# Patient Record
Sex: Female | Born: 1955
Health system: Southern US, Community
[De-identification: ages and names within clinical notes are randomized; demographics above are authoritative.]

## PROBLEM LIST (undated history)

## (undated) DIAGNOSIS — D219 Benign neoplasm of connective and other soft tissue, unspecified: Secondary | ICD-10-CM

## (undated) DIAGNOSIS — T884XXA Failed or difficult intubation, initial encounter: Secondary | ICD-10-CM

## (undated) DIAGNOSIS — B029 Zoster without complications: Secondary | ICD-10-CM

## (undated) DIAGNOSIS — K589 Irritable bowel syndrome without diarrhea: Secondary | ICD-10-CM

## (undated) DIAGNOSIS — E785 Hyperlipidemia, unspecified: Secondary | ICD-10-CM

## (undated) DIAGNOSIS — R87619 Unspecified abnormal cytological findings in specimens from cervix uteri: Secondary | ICD-10-CM

## (undated) DIAGNOSIS — I10 Essential (primary) hypertension: Secondary | ICD-10-CM

## (undated) DIAGNOSIS — C801 Malignant (primary) neoplasm, unspecified: Secondary | ICD-10-CM

## (undated) HISTORY — DX: Hyperlipidemia, unspecified: E78.5

## (undated) HISTORY — DX: Malignant (primary) neoplasm, unspecified: C80.1

## (undated) HISTORY — DX: Irritable bowel syndrome, unspecified: K58.9

## (undated) HISTORY — PX: TUBAL LIGATION: SHX77

## (undated) HISTORY — DX: Benign neoplasm of connective and other soft tissue, unspecified: D21.9

## (undated) HISTORY — DX: Unspecified abnormal cytological findings in specimens from cervix uteri: R87.619

## (undated) HISTORY — PX: CERVICAL BIOPSY  W/ LOOP ELECTRODE EXCISION: SUR135

## (undated) HISTORY — DX: Essential (primary) hypertension: I10

## (undated) HISTORY — DX: Zoster without complications: B02.9

## (undated) HISTORY — PX: TONSILLECTOMY: SUR1361

## (undated) HISTORY — PX: COLPOSCOPY: SHX161

---

## 1998-02-07 ENCOUNTER — Other Ambulatory Visit: Admission: RE | Admit: 1998-02-07 | Discharge: 1998-02-07 | Payer: Self-pay | Admitting: *Deleted

## 1998-06-17 ENCOUNTER — Ambulatory Visit (HOSPITAL_COMMUNITY): Admission: RE | Admit: 1998-06-17 | Discharge: 1998-06-17 | Payer: Self-pay | Admitting: Gastroenterology

## 1998-08-23 ENCOUNTER — Other Ambulatory Visit: Admission: RE | Admit: 1998-08-23 | Discharge: 1998-08-23 | Payer: Self-pay | Admitting: *Deleted

## 1999-05-01 ENCOUNTER — Other Ambulatory Visit: Admission: RE | Admit: 1999-05-01 | Discharge: 1999-05-01 | Payer: Self-pay | Admitting: *Deleted

## 1999-11-03 ENCOUNTER — Encounter: Payer: Self-pay | Admitting: Emergency Medicine

## 1999-11-03 ENCOUNTER — Encounter: Admission: RE | Admit: 1999-11-03 | Discharge: 1999-11-03 | Payer: Self-pay | Admitting: Emergency Medicine

## 2000-02-24 ENCOUNTER — Other Ambulatory Visit: Admission: RE | Admit: 2000-02-24 | Discharge: 2000-02-24 | Payer: Self-pay | Admitting: *Deleted

## 2000-08-26 ENCOUNTER — Other Ambulatory Visit: Admission: RE | Admit: 2000-08-26 | Discharge: 2000-08-26 | Payer: Self-pay | Admitting: *Deleted

## 2000-11-03 ENCOUNTER — Encounter: Admission: RE | Admit: 2000-11-03 | Discharge: 2000-11-03 | Payer: Self-pay | Admitting: *Deleted

## 2000-11-09 ENCOUNTER — Encounter: Payer: Self-pay | Admitting: Emergency Medicine

## 2000-11-09 ENCOUNTER — Encounter: Admission: RE | Admit: 2000-11-09 | Discharge: 2000-11-09 | Payer: Self-pay | Admitting: Emergency Medicine

## 2001-03-30 ENCOUNTER — Other Ambulatory Visit: Admission: RE | Admit: 2001-03-30 | Discharge: 2001-03-30 | Payer: Self-pay | Admitting: *Deleted

## 2001-10-12 ENCOUNTER — Other Ambulatory Visit: Admission: RE | Admit: 2001-10-12 | Discharge: 2001-10-12 | Payer: Self-pay | Admitting: Obstetrics and Gynecology

## 2001-12-01 ENCOUNTER — Encounter: Payer: Self-pay | Admitting: Emergency Medicine

## 2001-12-01 ENCOUNTER — Encounter: Admission: RE | Admit: 2001-12-01 | Discharge: 2001-12-01 | Payer: Self-pay | Admitting: Emergency Medicine

## 2002-01-11 ENCOUNTER — Other Ambulatory Visit: Admission: RE | Admit: 2002-01-11 | Discharge: 2002-01-11 | Payer: Self-pay | Admitting: Obstetrics and Gynecology

## 2002-03-31 ENCOUNTER — Other Ambulatory Visit: Admission: RE | Admit: 2002-03-31 | Discharge: 2002-03-31 | Payer: Self-pay | Admitting: Obstetrics and Gynecology

## 2002-04-13 HISTORY — PX: CARPAL TUNNEL RELEASE: SHX101

## 2002-10-10 ENCOUNTER — Other Ambulatory Visit: Admission: RE | Admit: 2002-10-10 | Discharge: 2002-10-10 | Payer: Self-pay | Admitting: Obstetrics and Gynecology

## 2002-12-05 ENCOUNTER — Encounter: Admission: RE | Admit: 2002-12-05 | Discharge: 2002-12-05 | Payer: Self-pay | Admitting: Emergency Medicine

## 2002-12-05 ENCOUNTER — Encounter: Payer: Self-pay | Admitting: Emergency Medicine

## 2003-05-29 ENCOUNTER — Other Ambulatory Visit: Admission: RE | Admit: 2003-05-29 | Discharge: 2003-05-29 | Payer: Self-pay | Admitting: Obstetrics and Gynecology

## 2003-10-24 ENCOUNTER — Other Ambulatory Visit: Admission: RE | Admit: 2003-10-24 | Discharge: 2003-10-24 | Payer: Self-pay | Admitting: Obstetrics and Gynecology

## 2003-12-06 ENCOUNTER — Encounter: Admission: RE | Admit: 2003-12-06 | Discharge: 2003-12-06 | Payer: Self-pay | Admitting: Emergency Medicine

## 2004-02-20 ENCOUNTER — Other Ambulatory Visit: Admission: RE | Admit: 2004-02-20 | Discharge: 2004-02-20 | Payer: Self-pay | Admitting: Obstetrics and Gynecology

## 2004-06-06 ENCOUNTER — Other Ambulatory Visit: Admission: RE | Admit: 2004-06-06 | Discharge: 2004-06-06 | Payer: Self-pay | Admitting: Obstetrics and Gynecology

## 2004-12-10 ENCOUNTER — Encounter: Admission: RE | Admit: 2004-12-10 | Discharge: 2004-12-10 | Payer: Self-pay | Admitting: Emergency Medicine

## 2004-12-24 ENCOUNTER — Other Ambulatory Visit: Admission: RE | Admit: 2004-12-24 | Discharge: 2004-12-24 | Payer: Self-pay | Admitting: Obstetrics and Gynecology

## 2005-06-29 ENCOUNTER — Other Ambulatory Visit: Admission: RE | Admit: 2005-06-29 | Discharge: 2005-06-29 | Payer: Self-pay | Admitting: Obstetrics and Gynecology

## 2005-12-03 ENCOUNTER — Ambulatory Visit (HOSPITAL_COMMUNITY): Admission: RE | Admit: 2005-12-03 | Discharge: 2005-12-03 | Payer: Self-pay | Admitting: Emergency Medicine

## 2005-12-16 ENCOUNTER — Encounter: Admission: RE | Admit: 2005-12-16 | Discharge: 2005-12-16 | Payer: Self-pay | Admitting: Obstetrics and Gynecology

## 2006-06-30 ENCOUNTER — Other Ambulatory Visit: Admission: RE | Admit: 2006-06-30 | Discharge: 2006-06-30 | Payer: Self-pay | Admitting: Obstetrics and Gynecology

## 2006-12-21 ENCOUNTER — Encounter: Admission: RE | Admit: 2006-12-21 | Discharge: 2006-12-21 | Payer: Self-pay | Admitting: Obstetrics and Gynecology

## 2007-07-06 ENCOUNTER — Other Ambulatory Visit: Admission: RE | Admit: 2007-07-06 | Discharge: 2007-07-06 | Payer: Self-pay | Admitting: Obstetrics and Gynecology

## 2007-12-22 ENCOUNTER — Encounter: Admission: RE | Admit: 2007-12-22 | Discharge: 2007-12-22 | Payer: Self-pay | Admitting: Obstetrics and Gynecology

## 2008-07-11 ENCOUNTER — Other Ambulatory Visit: Admission: RE | Admit: 2008-07-11 | Discharge: 2008-07-11 | Payer: Self-pay | Admitting: Obstetrics and Gynecology

## 2009-01-01 ENCOUNTER — Encounter: Admission: RE | Admit: 2009-01-01 | Discharge: 2009-01-01 | Payer: Self-pay

## 2010-01-02 ENCOUNTER — Encounter: Admission: RE | Admit: 2010-01-02 | Discharge: 2010-01-02 | Payer: Self-pay | Admitting: Anesthesiology

## 2010-03-23 ENCOUNTER — Emergency Department (HOSPITAL_COMMUNITY)
Admission: EM | Admit: 2010-03-23 | Discharge: 2010-03-23 | Payer: Self-pay | Source: Home / Self Care | Admitting: Family Medicine

## 2010-04-13 HISTORY — PX: ROTATOR CUFF REPAIR: SHX139

## 2010-06-13 ENCOUNTER — Other Ambulatory Visit: Payer: Self-pay | Admitting: Orthopaedic Surgery

## 2010-06-13 DIAGNOSIS — R52 Pain, unspecified: Secondary | ICD-10-CM

## 2010-06-18 ENCOUNTER — Ambulatory Visit
Admission: RE | Admit: 2010-06-18 | Discharge: 2010-06-18 | Disposition: A | Discharge status: Home / Self Care | Payer: PRIVATE HEALTH INSURANCE | Source: Ambulatory Visit | Attending: Orthopaedic Surgery | Admitting: Orthopaedic Surgery

## 2010-06-18 DIAGNOSIS — R52 Pain, unspecified: Secondary | ICD-10-CM

## 2010-11-27 ENCOUNTER — Other Ambulatory Visit: Payer: Self-pay | Admitting: Internal Medicine

## 2010-11-28 ENCOUNTER — Other Ambulatory Visit: Payer: Self-pay | Admitting: Internal Medicine

## 2010-11-28 DIAGNOSIS — Z1231 Encounter for screening mammogram for malignant neoplasm of breast: Secondary | ICD-10-CM

## 2011-01-07 ENCOUNTER — Ambulatory Visit
Admission: RE | Admit: 2011-01-07 | Discharge: 2011-01-07 | Disposition: A | Discharge status: Home / Self Care | Payer: PRIVATE HEALTH INSURANCE | Source: Ambulatory Visit | Attending: Internal Medicine | Admitting: Internal Medicine

## 2011-01-07 DIAGNOSIS — Z1231 Encounter for screening mammogram for malignant neoplasm of breast: Secondary | ICD-10-CM

## 2011-12-09 ENCOUNTER — Other Ambulatory Visit: Payer: Self-pay | Admitting: Obstetrics and Gynecology

## 2011-12-09 DIAGNOSIS — Z1231 Encounter for screening mammogram for malignant neoplasm of breast: Secondary | ICD-10-CM

## 2012-01-08 ENCOUNTER — Ambulatory Visit
Admission: RE | Admit: 2012-01-08 | Discharge: 2012-01-08 | Disposition: A | Discharge status: Home / Self Care | Payer: Commercial Managed Care - PPO | Source: Ambulatory Visit | Attending: Obstetrics and Gynecology | Admitting: Obstetrics and Gynecology

## 2012-01-08 DIAGNOSIS — Z1231 Encounter for screening mammogram for malignant neoplasm of breast: Secondary | ICD-10-CM

## 2012-08-24 ENCOUNTER — Encounter: Payer: Self-pay | Admitting: Certified Nurse Midwife

## 2012-08-25 ENCOUNTER — Encounter: Payer: Self-pay | Admitting: Certified Nurse Midwife

## 2012-08-25 ENCOUNTER — Ambulatory Visit (INDEPENDENT_AMBULATORY_CARE_PROVIDER_SITE_OTHER): Payer: Commercial Managed Care - PPO | Admitting: Certified Nurse Midwife

## 2012-08-25 VITALS — BP 126/80 | Ht 63.0 in | Wt 203.0 lb

## 2012-08-25 DIAGNOSIS — Z0000 Encounter for general adult medical examination without abnormal findings: Secondary | ICD-10-CM

## 2012-08-25 DIAGNOSIS — Z01419 Encounter for gynecological examination (general) (routine) without abnormal findings: Secondary | ICD-10-CM

## 2012-08-25 DIAGNOSIS — B373 Candidiasis of vulva and vagina: Secondary | ICD-10-CM

## 2012-08-25 DIAGNOSIS — N952 Postmenopausal atrophic vaginitis: Secondary | ICD-10-CM

## 2012-08-25 LAB — POCT URINALYSIS DIPSTICK: Protein, UA: NEGATIVE

## 2012-08-25 MED ORDER — TERCONAZOLE 0.4 % VA CREA: 1.0000 | TOPICAL_CREAM | Freq: Every day | VAGINAL | Status: DC

## 2012-08-25 NOTE — Progress Notes (Signed)
57 y.o. G69P0010 Married Caucasian Fe here for annual exam. Menopausal no HRT.  Denies any vaginal bleeding, but some vaginal dryness.  Was treated for URI with antibiotics and has some vaginal itching of/on. No new personal products. Also, she has additional complaints of heart palpitations x 2 lasting a few seconds at work.  Denies SOB, pain or faintness.  Has PCP for evaluation and aex first of June.  Working on regular exercise at gym 3-4 times weekly.  Does drink caffeine in a.m. Feels heart episode stressed related to work. Hypertension/ elevated cholesterol stable on medication.  No other health issues today    Patient's last menstrual period was 04/13/2004.          Sexually active: no  The current method of family planning is tubal ligation.    Exercising: yes  gym & clogging Smoker:  no  Health Maintenance: Pap:  08-21-11 neg HPV HR neg MMG:  01-08-12 normal Colonoscopy:  2009  BMD:   9/12 TDaP:  ? Date with PCP Labs: Poct urine-neg Self breast exam: occ   reports that she has never smoked. She does not have any smokeless tobacco history on file. She reports that  drinks alcohol. She reports that she does not use illicit drugs.  Past Medical History  Diagnosis Date  . Hypertension   . IBS (irritable bowel syndrome)   . Fibroid     5 cm  . Hyperlipidemia   . Cancer     melanoma lower left leg    Past Surgical History  Procedure Laterality Date  . Cervical biopsy  w/ loop electrode excision      CIN1  . Colposcopy      VAIN1  . Tubal ligation    . Carpal tunnel release  2004    left & right  . Rotator cuff repair Right 2012  . Tonsillectomy      Current Outpatient Prescriptions  Medication Sig Dispense Refill  . atorvastatin (LIPITOR) 10 MG tablet Take 10 mg by mouth daily.      Marland Kitchen Bioflavonoid Products (GRAPE SEED PO) Take by mouth daily.      . Coenzyme Q10 (CO Q 10 PO) Take by mouth daily.      . Cyanocobalamin (VITAMIN B-12 PO) Take by mouth daily.      .  Ibuprofen (ADVIL PO) Take by mouth as needed.      Marland Kitchen losartan-hydrochlorothiazide (HYZAAR) 50-12.5 MG per tablet daily.      . montelukast (SINGULAIR) 10 MG tablet as needed.      . Multiple Vitamins-Minerals (MULTIVITAMIN PO) Take by mouth daily.      . Pyridoxine HCl (VITAMIN B-6 PO) Take by mouth as needed.        No current facility-administered medications for this visit.    Family History  Problem Relation Age of Onset  . Cancer Mother     uterine & colon  . Hypertension Mother   . Cancer Sister     lung  . Hypertension Sister     ROS:  Pertinent items are noted in HPI.  Otherwise, a comprehensive ROS was negative.  Exam:   BP 126/80  Ht 5\' 3"  (1.6 m)  Wt 203 lb (92.08 kg)  BMI 35.97 kg/m2  LMP 04/13/2004 Height: 5\' 3"  (160 cm)  Ht Readings from Last 3 Encounters:  08/25/12 5\' 3"  (1.6 m)    General appearance: alert, cooperative and appears stated age Head: Normocephalic, without obvious abnormality, atraumatic Neck: no adenopathy,  supple, symmetrical, trachea midline and thyroid normal to inspection and palpation Lungs: clear to auscultation bilaterally Breasts: normal appearance, no masses or tenderness, No nipple retraction or dimpling, No nipple discharge or bleeding, No axillary or supraclavicular adenopathy Heart: regular rate and rhythm Abdomen: soft, non-tender; no masses,  no organomegaly Extremities: extremities normal, atraumatic, no cyanosis or edema Skin: Skin color, texture, turgor normal. No rashes or lesions Lymph nodes: Cervical, supraclavicular, and axillary nodes normal. No abnormal inguinal nodes palpated Neurologic: Grossly normal   Pelvic: External genitalia:  no lesions              Urethra:  normal appearing urethra with no masses, tenderness or lesions              Bartholin's and Skene's: normal                 Vagina: atrophic appearing vagina with normal color, whtie discharge, no lesions or thickening of skin or change in pigmentation   Wet prep taken              Cervix: normal, non tender              Pap taken: no Bimanual Exam:  Uterus:  normal size, contour, position, consistency, mobility, non-tender and anteflexed              Adnexa: normal adnexa and no mass, fullness, tenderness               Rectovaginal: Confirms               Anus:  normal sphincter tone, no lesions Wet Prep: Positive yeast, negative clue Ph 4.5   A:  Well Woman with normal exam  Menopausal no HRT  Yeast Vaginitis  Atrophic Vaginitis  Non specific heart palpitation short duration, x2  Has PCP evaluation scheduled  History of VAIN 1  2005   P:   Pap smear as per guidelines   mammogram pap smear counseled on breast self exam, feminine hygiene, adequate intake of calcium and vitamin D, diet and exercise return annually or prn  An After Visit Summary was printed and given to the patient.  Reviewed, TL

## 2012-08-25 NOTE — Patient Instructions (Signed)

## 2012-08-26 LAB — VITAMIN D 25 HYDROXY (VIT D DEFICIENCY, FRACTURES): Vit D, 25-Hydroxy: 41 ng/mL (ref 30–89)

## 2012-08-29 ENCOUNTER — Telehealth: Payer: Self-pay

## 2012-08-29 NOTE — Telephone Encounter (Signed)
[email or web address]

## 2012-08-30 NOTE — Telephone Encounter (Signed)
Patient returned call to Joy in reference to her test results.

## 2012-08-30 NOTE — Telephone Encounter (Signed)
Patient notified of vitamin d lab results 

## 2012-11-04 LAB — HM COLONOSCOPY

## 2012-12-19 ENCOUNTER — Other Ambulatory Visit: Payer: Self-pay

## 2012-12-19 DIAGNOSIS — Z1231 Encounter for screening mammogram for malignant neoplasm of breast: Secondary | ICD-10-CM

## 2013-01-09 ENCOUNTER — Ambulatory Visit: Payer: Commercial Managed Care - PPO

## 2013-01-10 ENCOUNTER — Ambulatory Visit
Admission: RE | Admit: 2013-01-10 | Discharge: 2013-01-10 | Disposition: A | Discharge status: Home / Self Care | Payer: Commercial Managed Care - PPO | Source: Ambulatory Visit

## 2013-01-10 DIAGNOSIS — Z1231 Encounter for screening mammogram for malignant neoplasm of breast: Secondary | ICD-10-CM

## 2013-08-29 ENCOUNTER — Encounter: Payer: Self-pay | Admitting: Certified Nurse Midwife

## 2013-08-29 ENCOUNTER — Ambulatory Visit (INDEPENDENT_AMBULATORY_CARE_PROVIDER_SITE_OTHER): Payer: Commercial Managed Care - PPO | Admitting: Certified Nurse Midwife

## 2013-08-29 VITALS — BP 142/82 | HR 64 | Resp 16 | Ht 63.0 in | Wt 207.0 lb

## 2013-08-29 DIAGNOSIS — Z01419 Encounter for gynecological examination (general) (routine) without abnormal findings: Secondary | ICD-10-CM

## 2013-08-29 DIAGNOSIS — Z0000 Encounter for general adult medical examination without abnormal findings: Secondary | ICD-10-CM

## 2013-08-29 DIAGNOSIS — E559 Vitamin D deficiency, unspecified: Secondary | ICD-10-CM

## 2013-08-29 DIAGNOSIS — N950 Postmenopausal bleeding: Secondary | ICD-10-CM

## 2013-08-29 DIAGNOSIS — Z124 Encounter for screening for malignant neoplasm of cervix: Secondary | ICD-10-CM

## 2013-08-29 LAB — POCT URINALYSIS DIPSTICK
Bilirubin, UA: NEGATIVE
Glucose, UA: NEGATIVE
Ketones, UA: NEGATIVE
LEUKOCYTES UA: NEGATIVE
Nitrite, UA: NEGATIVE
Protein, UA: NEGATIVE
RBC UA: NEGATIVE
UROBILINOGEN UA: NEGATIVE
pH, UA: 5

## 2013-08-29 NOTE — Patient Instructions (Signed)

## 2013-08-29 NOTE — Progress Notes (Signed)
58 y.o. G1P0010 Sep Caucasian Fe here for annual exam.  Menopausal no vaginal dryness. Reports noted some blood on mini pads she wears for comfort, red in color a few days ago and some cramping like a period. She has had no bleeding since last LMP 04/13/04. Not sexually active, no vaginal symptoms of itching or burning, no incontinence. No bowel changes. Sees PCP for aex, labs and medication management of hypertension, asthma, and cholesterol with stable medications. No other health issues. Interviewing for new position tomorrow. Emotionally much better with separation!  Patient's last menstrual period was 04/13/2004.          Sexually active: no  The current method of family planning is tubal ligation.    Exercising: yes  gym Smoker:  no  Health Maintenance: Pap:  08/21/11 WNL/negative HR HPV MMG:  01/10/13-normal Colonoscopy:  7/14-repeat in 5 years BMD:   9/12 normal, due in 5 years TDaP:  2009-will check with PCP to make sure Labs: URINE-negative   reports that she has never smoked. She has never used smokeless tobacco. She reports that she drinks alcohol. She reports that she does not use illicit drugs.  Past Medical History  Diagnosis Date  . Hypertension   . IBS (irritable bowel syndrome)   . Fibroid     5 cm  . Hyperlipidemia   . Cancer     melanoma lower left leg  . Shingles     Past Surgical History  Procedure Laterality Date  . Cervical biopsy  w/ loop electrode excision      CIN1  . Colposcopy      VAIN1  . Tubal ligation    . Carpal tunnel release  2004    left & right  . Rotator cuff repair Right 2012  . Tonsillectomy      Current Outpatient Prescriptions  Medication Sig Dispense Refill  . atorvastatin (LIPITOR) 10 MG tablet Take 10 mg by mouth daily.      Marland Kitchen Bioflavonoid Products (GRAPE SEED PO) Take by mouth daily.      . Coenzyme Q10 (CO Q 10 PO) Take by mouth daily.      . Cyanocobalamin (VITAMIN B-12 PO) Take by mouth daily.      . fluticasone (FLONASE)  50 MCG/ACT nasal spray       . Ibuprofen (ADVIL PO) Take by mouth as needed.      Marland Kitchen losartan-hydrochlorothiazide (HYZAAR) 50-12.5 MG per tablet daily.      . montelukast (SINGULAIR) 10 MG tablet as needed.      . Multiple Vitamins-Minerals (MULTIVITAMIN PO) Take by mouth daily.      . Pyridoxine HCl (VITAMIN B-6 PO) Take by mouth as needed.       Marland Kitchen VITAMIN D, ERGOCALCIFEROL, PO Take 5,000 Int'l Units by mouth daily.      . valACYclovir (VALTREX) 1000 MG tablet        No current facility-administered medications for this visit.    Family History  Problem Relation Age of Onset  . Cancer Mother     uterine & colon  . Hypertension Mother   . Cancer Sister     lung  . Hypertension Sister     ROS:  Pertinent items are noted in HPI.  Otherwise, a comprehensive ROS was negative.  Exam:   BP 142/82  Pulse 64  Resp 16  Ht 5\' 3"  (1.6 m)  Wt 207 lb (93.895 kg)  BMI 36.68 kg/m2  LMP 04/13/2004 Height: 5\' 3"  (160  cm)  Ht Readings from Last 3 Encounters:  08/29/13 5\' 3"  (1.6 m)  08/25/12 5\' 3"  (1.6 m)    General appearance: alert, cooperative and appears stated age Head: Normocephalic, without obvious abnormality, atraumatic Neck: no adenopathy, supple, symmetrical, trachea midline and thyroid normal to inspection and palpation and non-palpable Lungs: clear to auscultation bilaterally Breasts: normal appearance, no masses or tenderness, No nipple retraction or dimpling, No nipple discharge or bleeding, No axillary or supraclavicular adenopathy Heart: regular rate and rhythm Abdomen: soft, non-tender; no masses,  no organomegaly Extremities: extremities normal, atraumatic, no cyanosis or edema Skin: Skin color, texture, turgor normal. No rashes or lesions Lymph nodes: Cervical, supraclavicular, and axillary nodes normal. No abnormal inguinal nodes palpated Neurologic: Grossly normal   Pelvic: External genitalia:  no lesions              Urethra:  normal appearing urethra with no  masses, tenderness or lesions              Bartholin's and Skene's: normal                 Vagina: normal appearing vagina with normal color and scant discharge, no lesions, no blood noted              Cervix: non tender, normal, bleeding with pap only, nullparous os              Pap taken: yes Bimanual Exam:  Uterus:  normal size, contour, position, consistency, mobility, non-tender and midposition              Adnexa: normal adnexa, no mass, fullness, tenderness and limited by body habitus in that area               Rectovaginal: Confirms               Anus:  normal sphincter tone, no lesions  A:  Well Woman with normal exam  Post menopausal bleeding  Hypertension/Cholesterol/ Asthma management with PCP  Recent marital separation  P:   Reviewed health and wellness pertinent to exam  Discussed concerns with PMB and need to evaluate with PUS, endometrial biopsy, possible SHG. Patient agreeable, she has always worried about having Ovarian cancer,even though no family history. Discussed ovaries will be evaluated on PUS, but no conclusive screening tool for ovarian cancer. Discussed with patient insurance personnel will [employer] and she will be advised of information and then will be scheduled for procedure. Patient voiced understanding.   Pap smear taken today with reflex  Mammogram yearly stressed  counseled on breast self exam, mammography screening, feminine hygiene, menopause, adequate intake of calcium and vitamin D, diet and exercise and weight loss. Discussed BMD not due yet due to normal findings.  Wished well with interview. return annually or prn  An After Visit Summary was printed and given to the patient.

## 2013-08-30 LAB — VITAMIN D 25 HYDROXY (VIT D DEFICIENCY, FRACTURES): VIT D 25 HYDROXY: 77 ng/mL (ref 30–89)

## 2013-08-30 LAB — IPS PAP TEST WITH REFLEX TO HPV

## 2013-08-30 NOTE — Addendum Note (Signed)
Addended by: Regina Eck on: 08/30/2013 05:48 PM   Modules accepted: Orders

## 2013-08-30 NOTE — Progress Notes (Signed)
Reviewed personally.  Felipa Emory, MD. Would recommend [facility] with endometrial biopsy unless significant out of pocket costs.

## 2013-09-05 ENCOUNTER — Telehealth: Payer: Self-pay | Admitting: Certified Nurse Midwife

## 2013-09-05 NOTE — Telephone Encounter (Signed)
Left message for patient to call back. Need to go over benefits for and schedule SHGM and Endo Bx.

## 2013-09-05 NOTE — Telephone Encounter (Signed)
Spoke with patient. Advised that per benefit quote received, she will be responsible for $35 copay when she comes in for SHGM/Endo Bx. Patient agreeable. Scheduled both procedures. Advised patient of 72 hour cancellation policy and $355 cancellation fee. Patient agreeable. Mailed the In-Office procedure form that includes appointment date and time, patient copay, and cancellation policy.

## 2013-09-12 ENCOUNTER — Other Ambulatory Visit: Payer: Self-pay | Admitting: Gynecology

## 2013-09-12 ENCOUNTER — Other Ambulatory Visit: Payer: Self-pay | Admitting: *Deleted

## 2013-09-12 ENCOUNTER — Ambulatory Visit: Payer: Commercial Managed Care - PPO

## 2013-09-12 ENCOUNTER — Ambulatory Visit (INDEPENDENT_AMBULATORY_CARE_PROVIDER_SITE_OTHER): Payer: Commercial Managed Care - PPO | Admitting: Gynecology

## 2013-09-12 VITALS — BP 150/84 | Ht 63.0 in | Wt 206.0 lb

## 2013-09-12 DIAGNOSIS — N950 Postmenopausal bleeding: Secondary | ICD-10-CM

## 2013-09-12 NOTE — Progress Notes (Signed)
       Pt here for PUS for PMB.  Pt reports only a fe red spots.   Images reviewed: uterus with multiple fibroids (8) ranging in size 1-2cm, ems 2.82mm. Ovaries atrophic No free fluid. SHG performed to id lining: speculum placed, cervix cleansed with betadine, os stenotic.  Xylocaine jelly placed on anterior lip and endocervix.  Cervix dilated with multiple dilators until os finder able to pass, insemination catheter placed and walls gently distended, no mass noted, fibroids not obstructing cavity EMB: pipelle advanced, small amount of tissue obtained, tissue to path Tolerated well  will triage based on results

## 2013-09-15 LAB — IPS OTHER TISSUE BIOPSY

## 2013-09-20 ENCOUNTER — Telehealth: Payer: Self-pay

## 2013-09-20 NOTE — Telephone Encounter (Signed)
Message copied by Jasmine Awe on Wed Sep 20, 2013  5:05 PM ------      Message from: Elveria Rising      Created: Fri Sep 15, 2013 12:58 PM       Inform biopsy is benign but scant tissue as expected with thin lining, would recommend repeating evaluation if bleeding recurs ------

## 2013-09-20 NOTE — Telephone Encounter (Signed)
Left message to call Venus Ruhe at [phone]. 

## 2013-09-21 NOTE — Telephone Encounter (Signed)
Patient is returning a call to Kaitlyn. °

## 2013-09-21 NOTE — Telephone Encounter (Signed)
Spoke with patient. Advised of message and results as seen below from Dr.Lathrop. Patient agreeable and will call back if any further bleeding occurs.  Routing to provider for final review. Patient agreeable to disposition. Will close encounter

## 2014-01-19 ENCOUNTER — Other Ambulatory Visit: Payer: Self-pay

## 2014-01-19 DIAGNOSIS — Z1231 Encounter for screening mammogram for malignant neoplasm of breast: Secondary | ICD-10-CM

## 2014-02-09 ENCOUNTER — Ambulatory Visit
Admission: RE | Admit: 2014-02-09 | Discharge: 2014-02-09 | Disposition: A | Discharge status: Home / Self Care | Payer: PRIVATE HEALTH INSURANCE | Source: Ambulatory Visit

## 2014-02-09 DIAGNOSIS — Z1231 Encounter for screening mammogram for malignant neoplasm of breast: Secondary | ICD-10-CM

## 2014-02-12 ENCOUNTER — Encounter: Payer: Self-pay | Admitting: Certified Nurse Midwife

## 2014-02-19 ENCOUNTER — Inpatient Hospital Stay (HOSPITAL_COMMUNITY): Payer: 59

## 2014-02-19 ENCOUNTER — Inpatient Hospital Stay (HOSPITAL_COMMUNITY)
Admission: AD | Admit: 2014-02-19 | Discharge: 2014-02-19 | Disposition: A | Discharge status: Home / Self Care | Payer: 59 | Source: Ambulatory Visit | Attending: Obstetrics & Gynecology | Admitting: Obstetrics & Gynecology

## 2014-02-19 ENCOUNTER — Encounter (HOSPITAL_COMMUNITY): Payer: Self-pay | Admitting: Obstetrics and Gynecology

## 2014-02-19 ENCOUNTER — Telehealth: Payer: Self-pay

## 2014-02-19 DIAGNOSIS — M7918 Myalgia, other site: Secondary | ICD-10-CM

## 2014-02-19 DIAGNOSIS — D259 Leiomyoma of uterus, unspecified: Secondary | ICD-10-CM

## 2014-02-19 DIAGNOSIS — M791 Myalgia: Secondary | ICD-10-CM | POA: Insufficient documentation

## 2014-02-19 DIAGNOSIS — R1031 Right lower quadrant pain: Secondary | ICD-10-CM | POA: Insufficient documentation

## 2014-02-19 HISTORY — DX: Failed or difficult intubation, initial encounter: T88.4XXA

## 2014-02-19 LAB — CBC
HEMATOCRIT: 46.5 % — AB (ref 36.0–46.0)
Hemoglobin: 15.9 g/dL — ABNORMAL HIGH (ref 12.0–15.0)
MCH: 29.3 pg (ref 26.0–34.0)
MCHC: 34.2 g/dL (ref 30.0–36.0)
MCV: 85.6 fL (ref 78.0–100.0)
Platelets: 246 10*3/uL (ref 150–400)
RBC: 5.43 MIL/uL — ABNORMAL HIGH (ref 3.87–5.11)
RDW: 13.1 % (ref 11.5–15.5)
WBC: 5.4 10*3/uL (ref 4.0–10.5)

## 2014-02-19 LAB — URINALYSIS, ROUTINE W REFLEX MICROSCOPIC
Bilirubin Urine: NEGATIVE
GLUCOSE, UA: NEGATIVE mg/dL
Hgb urine dipstick: NEGATIVE
Ketones, ur: NEGATIVE mg/dL
LEUKOCYTES UA: NEGATIVE
Nitrite: NEGATIVE
Protein, ur: NEGATIVE mg/dL
SPECIFIC GRAVITY, URINE: 1.01 (ref 1.005–1.030)
UROBILINOGEN UA: 0.2 mg/dL (ref 0.0–1.0)
pH: 5 (ref 5.0–8.0)

## 2014-02-19 MED ORDER — IOHEXOL 300 MG/ML  SOLN
50.0000 mL | INTRAMUSCULAR | Status: AC
  Administered 2014-02-19 (×2): 50 mL via ORAL

## 2014-02-19 MED ORDER — OXYCODONE-ACETAMINOPHEN 5-325 MG PO TABS: 1.0000 | ORAL_TABLET | ORAL | Status: DC | PRN

## 2014-02-19 MED ORDER — IOHEXOL 300 MG/ML  SOLN
100.0000 mL | Freq: Once | INTRAMUSCULAR | Status: AC | PRN
  Administered 2014-02-19: 100 mL via INTRAVENOUS

## 2014-02-19 MED ORDER — KETOROLAC TROMETHAMINE 30 MG/ML IJ SOLN
30.0000 mg | Freq: Once | INTRAMUSCULAR | Status: AC
  Administered 2014-02-19: 30 mg via INTRAVENOUS
  Filled 2014-02-19: qty 1

## 2014-02-19 NOTE — MAU Note (Signed)
Pt starting oral contrast

## 2014-02-19 NOTE — MAU Note (Signed)
Pt states went to gym 2+weeks ago and was doing twisting and strained her back. After that pain resolved, she realized she was having pain in lower abdomen on r side only. Decreased pain while lying down/remaining still, rates 3-4. With movement, rates sharp intermittent pain 7-8.

## 2014-02-19 NOTE — Telephone Encounter (Signed)
Spoke with patient. Patient states that she hurt her back two weeks ago at the gym. States this pain has gone away but 1 week ago she began to have right lower quadrant pain. Patient states that she is unable to sit for long periods of time, drive, wipe when using the bathroom because it is so painful. States that pain is a sharp stabbing pain. Pain has gotten worse since being seen with Dr.Mackenzie on 11/6. Denies nausea and fevers. No urinary symptoms. Patient states that she is not currently in pain due to standing. "When I stand its okay but when I sit it is awful." Advised patient will need to be seen for evaluation. Patient is agreeable to come any time. Advised patient will speak with provider and return call with appointment time options to schedule. Patient is agreeable.

## 2014-02-19 NOTE — Discharge Instructions (Signed)

## 2014-02-19 NOTE — MAU Provider Note (Signed)
History     CSN: [id number]  Arrival date and time: 02/19/14 1320   First Provider Initiated Contact with Patient 02/19/14 1445      Chief Complaint  Patient presents with  . Abdominal Pain   HPI   [name] is a 58 y.o. female G1P0010 who presents with right lower quadrant pain that came on all of a sudden 1 week ago. She called the office today and was instructed to come to MAU for a CT scan. The pain is worse when she beds over only. States she has had a difficult time wiping her bottom because the pain is so severe with that position. The pain does not exacerbate with movement or with sitting; only when she is in the bending position. She attest to hurting her back at the gym several weeks ago, however feels this pain is not related.  Patient was recently separated from her husband; no new sexual partners.  OB History    Gravida Para Term Preterm AB TAB SAB Ectopic Multiple Living   1    1     0      Past Medical History  Diagnosis Date  . Hypertension   . IBS (irritable bowel syndrome)   . Fibroid     5 cm  . Hyperlipidemia   . Cancer     melanoma lower left leg  . Shingles   . Difficult intubation     took 3 tries with nerve block    Past Surgical History  Procedure Laterality Date  . Cervical biopsy  w/ loop electrode excision      CIN1  . Colposcopy      VAIN1  . Tubal ligation    . Carpal tunnel release  2004    left & right  . Rotator cuff repair Right 2012  . Tonsillectomy      Family History  Problem Relation Age of Onset  . Cancer Mother     uterine & colon  . Hypertension Mother   . Cancer Sister     lung  . Hypertension Sister     History  Substance Use Topics  . Smoking status: Never Smoker   . Smokeless tobacco: Never Used  . Alcohol Use: Yes     Comment: 2 a month    Allergies:  Allergies  Allergen Reactions  . Doxycycline Other (See Comments)    dizziness  . Hydrocodone     Flu like symptoms    Prescriptions  prior to admission  Medication Sig Dispense Refill Last Dose  . atorvastatin (LIPITOR) 10 MG tablet Take 10 mg by mouth daily.   02/19/2014 at 0800  . Coenzyme Q10 (CO Q 10 PO) Take by mouth daily.   02/18/2014 at Unknown time  . Cyanocobalamin (VITAMIN B-12 PO) Take by mouth daily.   02/19/2014 at 0800  . cyclobenzaprine (FLEXERIL) 10 MG tablet Take 5 mg by mouth at bedtime.   Past Week at Unknown time  . fluticasone (FLONASE) 50 MCG/ACT nasal spray    Past Month at Unknown time  . Ibuprofen (ADVIL PO) Take by mouth as needed.   Past Week at Unknown time  . losartan-hydrochlorothiazide (HYZAAR) 50-12.5 MG per tablet daily.   02/19/2014 at 0800  . montelukast (SINGULAIR) 10 MG tablet as needed.   Past Month at Unknown time  . Multiple Vitamins-Minerals (MULTIVITAMIN PO) Take by mouth daily.   02/18/2014 at 2200  . VITAMIN D, ERGOCALCIFEROL, PO Take 2,000 Int'l Units  by mouth daily.    02/19/2014 at Unknown time  . Bioflavonoid Products (GRAPE SEED PO) Take by mouth daily.   Taking  . Pyridoxine HCl (VITAMIN B-6 PO) Take by mouth as needed.    Completed Course at Unknown time  . valACYclovir (VALTREX) 1000 MG tablet    Completed Course at Unknown time   Results for orders placed or performed during the hospital encounter of 02/19/14 (from the past 48 hour(s))  Urinalysis, Routine w reflex microscopic     Status: None   Collection Time: 02/19/14  2:05 PM  Result Value Ref Range   Color, Urine YELLOW YELLOW   APPearance CLEAR CLEAR   Specific Gravity, Urine 1.010 1.005 - 1.030   pH 5.0 5.0 - 8.0   Glucose, UA NEGATIVE NEGATIVE mg/dL   Hgb urine dipstick NEGATIVE NEGATIVE   Bilirubin Urine NEGATIVE NEGATIVE   Ketones, ur NEGATIVE NEGATIVE mg/dL   Protein, ur NEGATIVE NEGATIVE mg/dL   Urobilinogen, UA 0.2 0.0 - 1.0 mg/dL   Nitrite NEGATIVE NEGATIVE   Leukocytes, UA NEGATIVE NEGATIVE    Comment: MICROSCOPIC NOT DONE ON URINES WITH NEGATIVE PROTEIN, BLOOD, LEUKOCYTES, NITRITE, OR GLUCOSE <1000  mg/dL.  CBC     Status: Abnormal   Collection Time: 02/19/14  2:55 PM  Result Value Ref Range   WBC 5.4 4.0 - 10.5 K/uL   RBC 5.43 (H) 3.87 - 5.11 MIL/uL   Hemoglobin 15.9 (H) 12.0 - 15.0 g/dL   HCT 46.5 (H) 36.0 - 46.0 %   MCV 85.6 78.0 - 100.0 fL   MCH 29.3 26.0 - 34.0 pg   MCHC 34.2 30.0 - 36.0 g/dL   RDW 13.1 11.5 - 15.5 %   Platelets 246 150 - 400 K/uL   Ct Abdomen Pelvis W Contrast  02/19/2014   CLINICAL DATA:  Right lower quadrant pain for 1 week.  EXAM: CT ABDOMEN AND PELVIS WITH CONTRAST  TECHNIQUE: Multidetector CT imaging of the abdomen and pelvis was performed using the standard protocol following bolus administration of intravenous contrast.  CONTRAST:  137mL OMNIPAQUE IOHEXOL 300 MG/ML  SOLN  COMPARISON:  None.  FINDINGS: Lower chest: The lung bases appear clear. No pleural or pericardial effusion noted.  Hepatobiliary: There is no suspicious liver abnormality.  Pancreas: Normal appearance of the pancreas.  Spleen: The spleen is normal.  Adrenals/Urinary Tract: The adrenal glands are both on unremarkable.  Stomach/Bowel: The stomach appears normal. The small bowel loops have a normal course and caliber. No obstruction. The appendix is visualized and appears normal. The colon is on unremarkable.  Vascular/Lymphatic: Normal caliber of the abdominal aorta. No aneurysm. There is no retroperitoneal or mesenteric adenopathy. No pelvic or inguinal adenopathy identified.  Reproductive: There is a subserosal fibroid arising from the right side of the uterus. This measures approximately 2.8 cm. The uterus and adnexal structures are otherwise unremarkable.  Other: No free fluid or abnormal fluid collections identified within the abdomen or pelvis.  Musculoskeletal: Review of the visualized osseous structures is significant for degenerative disc disease within the lumbar spine.  IMPRESSION: 1. No acute findings within the abdomen or pelvis. 2. The appendix is visualized and appears normal. 3.  Uterine fibroid.   Electronically Signed   By: Kerby Moors M.D.   On: 02/19/2014 17:41    Review of Systems  Constitutional: Negative for fever and chills.  Gastrointestinal: Positive for abdominal pain (Right lower quadrant ). Negative for nausea and vomiting.  Genitourinary: Negative for dysuria and urgency.  Physical Exam   Blood pressure 132/83, pulse 92, temperature 98.6 F (37 C), temperature source Oral, resp. rate 16, height 5\' 4"  (1.626 m), weight 87.907 kg (193 lb 12.8 oz), last menstrual period 04/13/2004, SpO2 99 %.  Physical Exam  Constitutional: She is oriented to person, place, and time. She appears well-developed and well-nourished. No distress.  Respiratory: Effort normal.  GI: Soft. Normal appearance. There is tenderness in the periumbilical area. There is rigidity. There is no rebound, no guarding and no CVA tenderness.  Genitourinary:  Bimanual exam: Cervix closed, No CMT  Uterus non tender, normal size Adnexa non tender, no masses bilaterally Chaperone present for exam.   Musculoskeletal: Normal range of motion.  Neurological: She is alert and oriented to person, place, and time.  Skin: Skin is warm. She is not diaphoretic.  Psychiatric: Her behavior is normal.    MAU Course  Procedures  None  MDM UA CBC Ct scan  1800: Discussed CT scan, labs and physical exam with Dr. Sabra Heck.   Assessment and Plan   A: 1. Uterine leiomyoma, unspecified location   2. Right lower quadrant pain   3. Right lower quadrant abdominal pain   4. Musculoskeletal pain    P: Discharge home in stable condition RX: Percocet Ok to continue to use flexeril  Ibuprofen as directed on the bottle Follow up with Dr. Ammie Ferrier office this week  Go to [facility] or [facility] if symptoms worsen.   Darrelyn Hillock Rasch, NP 02/19/2014 7:25 PM

## 2014-02-19 NOTE — MAU Note (Addendum)
Patient states she has been having right lower abdominal pain for about 10 days. Denies nausea, vomiting, bleeding, discharge or fever. Worse sharp pain with movement, getting worse. Was told to come today for a CT scan. Has been seeing her primary provider last week and referred to Dr. Sabra Heck.

## 2014-02-19 NOTE — Telephone Encounter (Signed)
Spoke with patient. Advised patient spoke with Dr.Miller who recommends that patient be seen in emergency room for CT to rule out appendicitis. Patient is agreeable and will head to [facility] now to have any imaging done that is necessary. Will return call if she needs anything.  Routing to [place] before closing.

## 2014-02-19 NOTE — Telephone Encounter (Signed)
Spoke with Fraser Din at Dr.Mackenzie's office. States that patient was seen in their office on 11/6 with RLQ pain that "feels like twisting." Patient called their practice today stating the pain was worsening. Would like to get patient in to be seen ASAP. Advised will call to speak with patient and get her scheduled. Fraser Din will fax records to our office.

## 2014-02-20 NOTE — Telephone Encounter (Signed)
Thank you. Encounter closed. 

## 2014-02-22 ENCOUNTER — Telehealth: Payer: Self-pay | Admitting: Emergency Medicine

## 2014-02-22 NOTE — Telephone Encounter (Signed)
Patient calling with ongoing RLQ pain.  Called her Gastroenterologist yesterday and did not schedule appointment because no IBS was seen on CT Scan done at [facility] 02/19/14.  Patient continues to have RLQ pain that radiates to R hip, worse with movement. Patient describes as "achy pressure and pain". Patient reports this pain is worse in the morning when she wakes up and then has a BM and then feels improved but then pain returns. Patient has this pain with sitting and driving and bending. Patient denies fevers, vaginal bleeding, vaginal discharge.  Took 3 Motrin today which "takes the edge off", oxycodone makes patient sleepy doesn't take away the pain.  Labs/CT done at [facility]. Dx DJD.   Spoke with Dr. Sabra Heck, patient to be referred to orthopedics for evaluation.   Spoke with patient. She is agreeable to orthopedic referral. Has seen Dr. Joni Fears in the past, though greater than 3 years. Patient requests first available. Appointment made for 02/26/14 at 130. Contact information given.  Patient declines referral to orthopedic Raliegh Ip for appointment today at 3:00. Patient would like to wait for Dr. Durward Fortes.   Advised patient return call if any concerns prior to appointment with Dr. Durward Fortes.  She is agreeable to plan and verbalized understanding of instructions.   Routing to provider for final review. Patient agreeable to disposition. Will close encounter

## 2014-02-28 ENCOUNTER — Other Ambulatory Visit: Payer: Self-pay | Admitting: Orthopaedic Surgery

## 2014-02-28 DIAGNOSIS — M545 Low back pain: Secondary | ICD-10-CM

## 2014-03-10 ENCOUNTER — Other Ambulatory Visit: Payer: PRIVATE HEALTH INSURANCE

## 2014-03-11 ENCOUNTER — Ambulatory Visit
Admission: RE | Admit: 2014-03-11 | Discharge: 2014-03-11 | Disposition: A | Discharge status: Home / Self Care | Payer: PRIVATE HEALTH INSURANCE | Source: Ambulatory Visit | Attending: Orthopaedic Surgery | Admitting: Orthopaedic Surgery

## 2014-03-11 DIAGNOSIS — M545 Low back pain: Secondary | ICD-10-CM

## 2014-09-05 ENCOUNTER — Ambulatory Visit (INDEPENDENT_AMBULATORY_CARE_PROVIDER_SITE_OTHER): Payer: Commercial Managed Care - PPO | Admitting: Certified Nurse Midwife

## 2014-09-05 ENCOUNTER — Encounter: Payer: Self-pay | Admitting: Certified Nurse Midwife

## 2014-09-05 VITALS — BP 120/80 | HR 64 | Resp 16 | Ht 62.5 in | Wt 190.0 lb

## 2014-09-05 DIAGNOSIS — Z0000 Encounter for general adult medical examination without abnormal findings: Secondary | ICD-10-CM

## 2014-09-05 DIAGNOSIS — B009 Herpesviral infection, unspecified: Secondary | ICD-10-CM

## 2014-09-05 DIAGNOSIS — Z01419 Encounter for gynecological examination (general) (routine) without abnormal findings: Secondary | ICD-10-CM | POA: Diagnosis not present

## 2014-09-05 DIAGNOSIS — Z124 Encounter for screening for malignant neoplasm of cervix: Secondary | ICD-10-CM | POA: Diagnosis not present

## 2014-09-05 LAB — POCT URINALYSIS DIPSTICK
Bilirubin, UA: NEGATIVE
GLUCOSE UA: NEGATIVE
Ketones, UA: NEGATIVE
Nitrite, UA: NEGATIVE
Protein, UA: NEGATIVE
RBC UA: NEGATIVE
Urobilinogen, UA: NEGATIVE
pH, UA: 5

## 2014-09-05 MED ORDER — VALACYCLOVIR HCL 1 G PO TABS: ORAL_TABLET | ORAL | Status: DC

## 2014-09-05 NOTE — Progress Notes (Signed)
Reviewed personally.  M. Suzanne Dayven Linsley, MD.  

## 2014-09-05 NOTE — Patient Instructions (Signed)
EXERCISE AND DIET:  We recommended that you start or continue a regular exercise program for good health. Regular exercise means any activity that makes your heart beat faster and makes you sweat.  We recommend exercising at least 30 minutes per day at least 3 days a week, preferably 4 or 5.  We also recommend a diet low in fat and sugar.  Inactivity, poor dietary choices and obesity can cause diabetes, heart attack, stroke, and kidney damage, among others.    ALCOHOL AND SMOKING:  Women should limit their alcohol intake to no more than 7 drinks/beers/glasses of wine (combined, not each!) per week. Moderation of alcohol intake to this level decreases your risk of breast cancer and liver damage. And of course, no recreational drugs are part of a healthy lifestyle.  And absolutely no smoking or even second hand smoke. Most people know smoking can cause heart and lung diseases, but did you know it also contributes to weakening of your bones? Aging of your skin?  Yellowing of your teeth and nails?  CALCIUM AND VITAMIN D:  Adequate intake of calcium and Vitamin D are recommended.  The recommendations for exact amounts of these supplements seem to change often, but generally speaking 600 mg of calcium (either carbonate or citrate) and 800 units of Vitamin D per day seems prudent. Certain women may benefit from higher intake of Vitamin D.  If you are among these women, your doctor will have told you during your visit.    PAP SMEARS:  Pap smears, to check for cervical cancer or precancers,  have traditionally been done yearly, although recent scientific advances have shown that most women can have pap smears less often.  However, every woman still should have a physical exam from her gynecologist every year. It will include a breast check, inspection of the vulva and vagina to check for abnormal growths or skin changes, a visual exam of the cervix, and then an exam to evaluate the size and shape of the uterus and  ovaries.  And after 61 years of age, a rectal exam is indicated to check for rectal cancers. We will also provide age appropriate advice regarding health maintenance, like when you should have certain vaccines, screening for sexually transmitted diseases, bone density testing, colonoscopy, mammograms, etc.   MAMMOGRAMS:  All women over 40 years old should have a yearly mammogram. Many facilities now offer a "3D" mammogram, which may cost around $50 extra out of pocket. If possible,  we recommend you accept the option to have the 3D mammogram performed.  It both reduces the number of women who will be called back for extra views which then turn out to be normal, and it is better than the routine mammogram at detecting truly abnormal areas.    COLONOSCOPY:  Colonoscopy to screen for colon cancer is recommended for all women at age 50.  We know, you hate the idea of the prep.  We agree, BUT, having colon cancer and not knowing it is worse!!  Colon cancer so often starts as a polyp that can be seen and removed at colonscopy, which can quite literally save your life!  And if your first colonoscopy is normal and you have no family history of colon cancer, most women don't have to have it again for 10 years.  Once every ten years, you can do something that may end up saving your life, right?  We will be happy to help you get it scheduled when you are ready.    Be sure to check your insurance coverage so you understand how much it will cost.  It may be covered as a preventative service at no cost, but you should check your particular policy.     Vitamin D Deficiency  Not having enough vitamin D is called a deficiency. Your body needs this vitamin to keep your bones strong and healthy. Having too little of it can make your bones soft or can cause other health problems.  HOME CARE  Take all vitamins, herbs, or nutrition drinks (supplements) as told by your doctor.  Have your blood tested 2 months after taking  vitamins, herbs, or nutrition drinks.  Eat foods that have vitamin D. This includes:  Dairy products, cereals, or juices with added vitamin D. Check the label.  Fatty fish like salmon or trout.  Eggs.  Oysters.  Do not use tanning beds.  Stay at a healthy weight. Lose weight if needed.  Keep all doctor visits as told. GET HELP IF:  You have questions.  You continue to have problems.  You feel sick to your stomach (nauseous) or throw up (vomit).  You cannot go poop (constipated).  You feel confused.  You have severe belly (abdominal) or back pain. MAKE SURE YOU:  Understand these instructions.  Will watch your condition.  Will get help right away if you are not doing well or get worse. Document Released: 03/19/2011 Document Revised: 07/25/2012 Document Reviewed: 03/19/2011 [facility] Patient Information 2015 [place], [place]. This information is not intended to replace advice given to you by your health care provider. Make sure you discuss any questions you have with your health care provider.

## 2014-09-05 NOTE — Progress Notes (Signed)
59 y.o. G1P0010 separated Caucasian Fe here for annual exam. Post menopausal no vaginal bleeding. Some vaginal dryness, uses Replens. Recent HSV outbreak on buttocks only had medicine for one day. Needs Rx. Has been eating better and now has lost 16 pounds. Sees PCP for medication management of cholesterol/hypertension/allergies/ labs and aex.  Emotionally doing well since separation, divorce will be final soon. Patient has complaint of swollen wrist and has been using a lot. Has had carpal tunnel surgery on left wrist. Will see MD if continues. Noted palpitations occasional when stressed, Denies chest pain or jaw pain, will notify PCP. No other health issues today.  Patient's last menstrual period was 04/13/2004.          Sexually active: No.  The current method of family planning is post menopausal status.    Exercising: Yes.    gym, jogging Smoker:  no  Health Maintenance: Pap: 08/29/13 Neg. Hx of Leep  MMG: 02/12/14 BIRADS1:Neg category b Self Breast Exam: yes, occ Colonoscopy:  7/14 repeat in 5 years  BMD:   12/2010 normal - repeat 5 years  TDaP:  2009 - PCP Labs: PCP. Vit D check here  UA: WBC=trace   reports that she has never smoked. She has never used smokeless tobacco. She reports that she drinks alcohol. She reports that she does not use illicit drugs.  Past Medical History  Diagnosis Date  . Hypertension   . IBS (irritable bowel syndrome)   . Fibroid     5 cm  . Hyperlipidemia   . Cancer     melanoma lower left leg  . Shingles   . Difficult intubation     took 3 tries with nerve block    Past Surgical History  Procedure Laterality Date  . Cervical biopsy  w/ loop electrode excision      CIN1  . Colposcopy      VAIN1  . Tubal ligation    . Carpal tunnel release  2004    left & right  . Rotator cuff repair Right 2012  . Tonsillectomy      Current Outpatient Prescriptions  Medication Sig Dispense Refill  . atorvastatin (LIPITOR) 10 MG tablet Take 10 mg by mouth  daily.    Marland Kitchen Bioflavonoid Products (GRAPE SEED PO) Take by mouth daily.    . Coenzyme Q10 (CO Q 10 PO) Take by mouth daily.    . Cyanocobalamin (VITAMIN B-12 PO) Take by mouth daily.    . cyclobenzaprine (FLEXERIL) 10 MG tablet Take 5 mg by mouth at bedtime.    . fluticasone (FLONASE) 50 MCG/ACT nasal spray     . Ibuprofen (ADVIL PO) Take by mouth as needed.    Marland Kitchen losartan-hydrochlorothiazide (HYZAAR) 50-12.5 MG per tablet daily.    . montelukast (SINGULAIR) 10 MG tablet as needed.    . Multiple Vitamins-Minerals (MULTIVITAMIN PO) Take by mouth daily.    . Pyridoxine HCl (VITAMIN B-6 PO) Take by mouth as needed.     . valACYclovir (VALTREX) 1000 MG tablet     . VITAMIN D, ERGOCALCIFEROL, PO Take 2,000 Int'l Units by mouth daily.      No current facility-administered medications for this visit.    Family History  Problem Relation Age of Onset  . Cancer Mother     uterine & colon  . Hypertension Mother   . Cancer Sister     lung  . Hypertension Sister     ROS:  Pertinent items are noted in HPI.  Otherwise,  a comprehensive ROS was negative.  Exam:   BP 120/80 mmHg  Pulse 64  Resp 16  Ht 5' 2.5" (1.588 m)  Wt 190 lb (86.183 kg)  BMI 34.18 kg/m2  LMP 04/13/2004 Height: 5' 2.5" (158.8 cm) Ht Readings from Last 3 Encounters:  09/05/14 5' 2.5" (1.588 m)  02/19/14 5\' 4"  (1.626 m)  09/12/13 5\' 3"  (1.6 m)    General appearance: alert, cooperative and appears stated age Head: Normocephalic, without obvious abnormality, atraumatic Neck: no adenopathy, supple, symmetrical, trachea midline and thyroid normal to inspection and palpation Lungs: clear to auscultation bilaterally Breasts: normal appearance, no masses or tenderness, No nipple retraction or dimpling, No nipple discharge or bleeding, No axillary or supraclavicular adenopathy Heart: regular rate and rhythm Abdomen: soft, non-tender; no masses,  no organomegaly Extremities: extremities normal, atraumatic, no cyanosis or  edema, left great toe has black spot under nail, denies injury Skin: Skin color, texture, turgor normal. No rashes or lesions Lymph nodes: Cervical, supraclavicular, and axillary nodes normal. No abnormal inguinal nodes palpated Neurologic: Grossly normal   Pelvic: External genitalia:  no lesions              Urethra:  normal appearing urethra with no masses, tenderness or lesions              Bartholin's and Skene's: normal                 Vagina: normal appearing vagina with normal color and discharge, no lesions              Cervix: normal appearance, no lesions, non tender              Pap taken: Yes.   Bimanual Exam:  Uterus:  normal size, contour, position, consistency, mobility, non-tender              Adnexa: normal adnexa and no mass, fullness, tenderness               Rectovaginal: Confirms               Anus:  normal sphincter tone, no lesions  Chaperone present: Yes  A:  Well Woman with normal exam  Menopausal no HRT  Vaginal dryness Replens working well  History of HSV needs refill on Valtrex  History of LEEP and VAIN1  Hypertension/Cholesterol/asthma management with PCP  History of Vitamin D deficiency, needs lab today  Height loss of 1/2 inch, BMD due  History of Melanoma Black area under great toe nail  P:   Reviewed health and wellness pertinent to exam  Aware of need to evaluate if vaginal bleeding  Rx Valtrex see order  Stressed importance of aex   Continue MD follow up as indicated  Lab Vit. D  Discussed height change and need for BMD, patient will schedule. Stressed good dietary calcium, exercise and stretching.  Pap smear taken with HPVHR  Discussed finding patient had seen Dermatology and under follow up  counseled on breast self exam, mammography screening, menopause, adequate intake of calcium and vitamin D, diet and exercise  return annually or prn  An After Visit Summary was printed and given to the patient.

## 2014-09-06 LAB — VITAMIN D 25 HYDROXY (VIT D DEFICIENCY, FRACTURES): Vit D, 25-Hydroxy: 52 ng/mL (ref 30–100)

## 2014-09-11 LAB — IPS PAP TEST WITH HPV

## 2015-01-14 ENCOUNTER — Other Ambulatory Visit: Payer: Self-pay

## 2015-01-14 ENCOUNTER — Telehealth: Payer: Self-pay | Admitting: Certified Nurse Midwife

## 2015-01-14 DIAGNOSIS — E2839 Other primary ovarian failure: Secondary | ICD-10-CM

## 2015-01-14 DIAGNOSIS — Z780 Asymptomatic menopausal state: Secondary | ICD-10-CM

## 2015-01-14 DIAGNOSIS — Z1231 Encounter for screening mammogram for malignant neoplasm of breast: Secondary | ICD-10-CM

## 2015-01-14 NOTE — Telephone Encounter (Signed)
Order for BMD placed via [facility] to [facility]. Spoke with patient. Advised order has been placed for BMD. Patient is agreeable and states she is already scheduled for the appointment.  Routing to provider for final review. Patient agreeable to disposition. Will close encounter.

## 2015-01-14 NOTE — Telephone Encounter (Signed)
Patient calling requesting an order for a bone density 02/14/15 at [facility].

## 2015-02-14 ENCOUNTER — Ambulatory Visit
Admission: RE | Admit: 2015-02-14 | Discharge: 2015-02-14 | Disposition: A | Discharge status: Home / Self Care | Payer: Commercial Managed Care - PPO | Source: Ambulatory Visit | Attending: Certified Nurse Midwife | Admitting: Certified Nurse Midwife

## 2015-02-14 ENCOUNTER — Ambulatory Visit
Admission: RE | Admit: 2015-02-14 | Discharge: 2015-02-14 | Disposition: A | Discharge status: Home / Self Care | Payer: Commercial Managed Care - PPO | Source: Ambulatory Visit

## 2015-02-14 DIAGNOSIS — Z1231 Encounter for screening mammogram for malignant neoplasm of breast: Secondary | ICD-10-CM

## 2015-02-14 DIAGNOSIS — Z780 Asymptomatic menopausal state: Secondary | ICD-10-CM

## 2015-02-14 DIAGNOSIS — E2839 Other primary ovarian failure: Secondary | ICD-10-CM

## 2015-02-26 ENCOUNTER — Telehealth: Payer: Self-pay | Admitting: Certified Nurse Midwife

## 2015-02-26 NOTE — Telephone Encounter (Signed)
Patient notified of results as written by provider 

## 2015-02-26 NOTE — Telephone Encounter (Signed)
Notify patient that I have reviewed her BMD and it is normal. Repeat in 2 years.

## 2015-02-26 NOTE — Telephone Encounter (Signed)
[facility] was checking on the status of results and if [name] had reviewed them. Patient aware that results have been sent through [facility].  Routed to Ms. Delorse Limber

## 2015-04-24 DIAGNOSIS — Z85820 Personal history of malignant melanoma of skin: Secondary | ICD-10-CM | POA: Insufficient documentation

## 2015-04-24 DIAGNOSIS — E785 Hyperlipidemia, unspecified: Secondary | ICD-10-CM | POA: Insufficient documentation

## 2015-04-24 DIAGNOSIS — Z8719 Personal history of other diseases of the digestive system: Secondary | ICD-10-CM | POA: Insufficient documentation

## 2015-04-24 DIAGNOSIS — B029 Zoster without complications: Secondary | ICD-10-CM | POA: Insufficient documentation

## 2015-04-24 DIAGNOSIS — T884XXA Failed or difficult intubation, initial encounter: Secondary | ICD-10-CM | POA: Insufficient documentation

## 2015-04-24 DIAGNOSIS — D219 Benign neoplasm of connective and other soft tissue, unspecified: Secondary | ICD-10-CM | POA: Insufficient documentation

## 2015-04-24 DIAGNOSIS — I10 Essential (primary) hypertension: Secondary | ICD-10-CM | POA: Insufficient documentation

## 2015-04-25 ENCOUNTER — Ambulatory Visit: Payer: Commercial Managed Care - PPO | Admitting: Physician Assistant

## 2015-04-25 ENCOUNTER — Ambulatory Visit (INDEPENDENT_AMBULATORY_CARE_PROVIDER_SITE_OTHER): Payer: Commercial Managed Care - PPO

## 2015-04-25 DIAGNOSIS — I208 Other forms of angina pectoris: Secondary | ICD-10-CM

## 2015-04-25 DIAGNOSIS — I2089 Other forms of angina pectoris: Secondary | ICD-10-CM

## 2015-04-25 LAB — EXERCISE TOLERANCE TEST
CHL CUP MPHR: 159 {beats}/min
CHL CUP RESTING HR STRESS: 60 {beats}/min
CSEPHR: 87 %
Estimated workload: 10.1 METS
Exercise duration (min): 9 min
Peak HR: 139 {beats}/min
RPE: 14

## 2015-09-12 ENCOUNTER — Ambulatory Visit (INDEPENDENT_AMBULATORY_CARE_PROVIDER_SITE_OTHER): Payer: Commercial Managed Care - PPO | Admitting: Certified Nurse Midwife

## 2015-09-12 ENCOUNTER — Encounter: Payer: Self-pay | Admitting: Certified Nurse Midwife

## 2015-09-12 VITALS — BP 120/78 | HR 70 | Resp 16 | Ht 62.5 in | Wt 182.0 lb

## 2015-09-12 DIAGNOSIS — Z01419 Encounter for gynecological examination (general) (routine) without abnormal findings: Secondary | ICD-10-CM | POA: Diagnosis not present

## 2015-09-12 DIAGNOSIS — Z0000 Encounter for general adult medical examination without abnormal findings: Secondary | ICD-10-CM

## 2015-09-12 DIAGNOSIS — N952 Postmenopausal atrophic vaginitis: Secondary | ICD-10-CM | POA: Diagnosis not present

## 2015-09-12 DIAGNOSIS — N9489 Other specified conditions associated with female genital organs and menstrual cycle: Secondary | ICD-10-CM

## 2015-09-12 DIAGNOSIS — E559 Vitamin D deficiency, unspecified: Secondary | ICD-10-CM

## 2015-09-12 DIAGNOSIS — B009 Herpesviral infection, unspecified: Secondary | ICD-10-CM | POA: Diagnosis not present

## 2015-09-12 DIAGNOSIS — N898 Other specified noninflammatory disorders of vagina: Secondary | ICD-10-CM

## 2015-09-12 LAB — POCT URINALYSIS DIPSTICK
Bilirubin, UA: NEGATIVE
Blood, UA: NEGATIVE
Glucose, UA: NEGATIVE
KETONES UA: NEGATIVE
Leukocytes, UA: NEGATIVE
Nitrite, UA: NEGATIVE
PROTEIN UA: NEGATIVE
Urobilinogen, UA: NEGATIVE
pH, UA: 5

## 2015-09-12 MED ORDER — VALACYCLOVIR HCL 1 G PO TABS: ORAL_TABLET | ORAL | Status: DC

## 2015-09-12 NOTE — Patient Instructions (Signed)
EXERCISE AND DIET:  We recommended that you start or continue a regular exercise program for good health. Regular exercise means any activity that makes your heart beat faster and makes you sweat.  We recommend exercising at least 30 minutes per day at least 3 days a week, preferably 4 or 5.  We also recommend a diet low in fat and sugar.  Inactivity, poor dietary choices and obesity can cause diabetes, heart attack, stroke, and kidney damage, among others.    ALCOHOL AND SMOKING:  Women should limit their alcohol intake to no more than 7 drinks/beers/glasses of wine (combined, not each!) per week. Moderation of alcohol intake to this level decreases your risk of breast cancer and liver damage. And of course, no recreational drugs are part of a healthy lifestyle.  And absolutely no smoking or even second hand smoke. Most people know smoking can cause heart and lung diseases, but did you know it also contributes to weakening of your bones? Aging of your skin?  Yellowing of your teeth and nails?  CALCIUM AND VITAMIN D:  Adequate intake of calcium and Vitamin D are recommended.  The recommendations for exact amounts of these supplements seem to change often, but generally speaking 600 mg of calcium (either carbonate or citrate) and 800 units of Vitamin D per day seems prudent. Certain women may benefit from higher intake of Vitamin D.  If you are among these women, your doctor will have told you during your visit.    PAP SMEARS:  Pap smears, to check for cervical cancer or precancers,  have traditionally been done yearly, although recent scientific advances have shown that most women can have pap smears less often.  However, every woman still should have a physical exam from her gynecologist every year. It will include a breast check, inspection of the vulva and vagina to check for abnormal growths or skin changes, a visual exam of the cervix, and then an exam to evaluate the size and shape of the uterus and  ovaries.  And after 62 years of age, a rectal exam is indicated to check for rectal cancers. We will also provide age appropriate advice regarding health maintenance, like when you should have certain vaccines, screening for sexually transmitted diseases, bone density testing, colonoscopy, mammograms, etc.   MAMMOGRAMS:  All women over 40 years old should have a yearly mammogram. Many facilities now offer a "3D" mammogram, which may cost around $50 extra out of pocket. If possible,  we recommend you accept the option to have the 3D mammogram performed.  It both reduces the number of women who will be called back for extra views which then turn out to be normal, and it is better than the routine mammogram at detecting truly abnormal areas.    COLONOSCOPY:  Colonoscopy to screen for colon cancer is recommended for all women at age 50.  We know, you hate the idea of the prep.  We agree, BUT, having colon cancer and not knowing it is worse!!  Colon cancer so often starts as a polyp that can be seen and removed at colonscopy, which can quite literally save your life!  And if your first colonoscopy is normal and you have no family history of colon cancer, most women don't have to have it again for 10 years.  Once every ten years, you can do something that may end up saving your life, right?  We will be happy to help you get it scheduled when you are ready.    Be sure to check your insurance coverage so you understand how much it will cost.  It may be covered as a preventative service at no cost, but you should check your particular policy.     Atrophic Vaginitis Atrophic vaginitis is when the tissues that line the vagina become dry and thin. This is caused by a drop in estrogen. Estrogen helps:  To keep the vagina moist.  To make a clear fluid that helps:  To lubricate the vagina for sex.  To protect the vagina from infection. If the lining of the vagina is dry and thin, it may:  Make sex painful. It may  also cause bleeding.  Cause a feeling of:  Burning.  Irritation.  Itchiness.  Make an exam of your vagina painful. It may also cause bleeding.  Make you lose interest in sex.  Cause a burning feeling when you pee.  Make your vaginal fluid (discharge) brown or yellow. For some women, there are no symptoms. This condition is most common in women who do not get their regular menstrual periods anymore (menopause). This often starts when a woman is 63-4 years old. HOME CARE  Take medicines only as told by your doctor. Do not use any herbal or alternative medicines unless your doctor says it is okay.  Use over-the-counter products for dryness only as told by your doctor. These include:  Creams.  Lubricants.  Moisturizers.  Do not douche.  Do not use products that can make your vagina dry. These include:  Scented feminine sprays.  Scented tampons.  Scented soaps.  If it hurts to have sex, tell your sexual partner. GET HELP IF:  Your discharge looks different than normal.  Your vagina has an unusual smell.  You have new symptoms.  Your symptoms do not get better with treatment.  Your symptoms get worse.   This information is not intended to replace advice given to you by your health care provider. Make sure you discuss any questions you have with your health care provider.   Document Released: 09/16/2007 Document Revised: 08/14/2014 Document Reviewed: 03/21/2014 Elsevier Interactive Patient Education [employer].

## 2015-09-12 NOTE — Progress Notes (Signed)
60 y.o. G1P0010 Divorced  Caucasian Fe here for annual exam. Menopausal no HRT. Working on weight loss down 8 pounds, and working out gym. Denies vaginal bleeding. Some vaginal dryness, has discharge daily, no itching or burning. No urinary incontinence unless with forceful sneeze. Wears a mini pad daily due to discharge.  Occasional HSV outbreak 2-3 times per year.  Sees PCP for medication management of asthma / hypertension/labs and aex. No other health issues today.  Patient's last menstrual period was 04/13/2004.          Sexually active: No.  The current method of family planning is tubal ligation.    Exercising: Yes.    gym, clogging Smoker:  no  Health Maintenance: Pap:  09-05-14 neg HPV HR neg, hx of LEEP MMG:  02-14-15 density a, birads 1:neg Colonoscopy:  7/14 f/u 89yrs BMD:   10/16 normal TDaP:  2009 Shingles: 2015, had swelling in arm Pneumonia: none Hep C and HIV: few yrs ago? Labs: poct urine-neg Self breast exam: done occ   reports that she has never smoked. She has never used smokeless tobacco. She reports that she drinks alcohol. She reports that she does not use illicit drugs.  Past Medical History  Diagnosis Date  . Hypertension   . IBS (irritable bowel syndrome)   . Fibroid     5 cm  . Hyperlipidemia   . Cancer ([place])     melanoma lower left leg  . Shingles   . Difficult intubation     took 3 tries with nerve block    Past Surgical History  Procedure Laterality Date  . Cervical biopsy  w/ loop electrode excision      CIN1  . Colposcopy      VAIN1  . Tubal ligation    . Carpal tunnel release  2004    left & right  . Rotator cuff repair Right 2012  . Tonsillectomy      Current Outpatient Prescriptions  Medication Sig Dispense Refill  . atorvastatin (LIPITOR) 10 MG tablet Take 10 mg by mouth daily.    Marland Kitchen azithromycin (ZITHROMAX) 250 MG tablet     . Bioflavonoid Products (GRAPE SEED PO) Take by mouth daily.    . brompheniramine-pseudoephedrine-DM  30-2-10 MG/5ML syrup     . Coenzyme Q10 (CO Q 10 PO) Take by mouth daily.    . Cyanocobalamin (VITAMIN B-12 PO) Take by mouth daily.    . cyclobenzaprine (FLEXERIL) 10 MG tablet Take 5 mg by mouth at bedtime.    . fluticasone (FLONASE) 50 MCG/ACT nasal spray     . Ibuprofen (ADVIL PO) Take by mouth as needed.    Marland Kitchen losartan-hydrochlorothiazide (HYZAAR) 50-12.5 MG per tablet daily.    . montelukast (SINGULAIR) 10 MG tablet as needed.    . Multiple Vitamins-Minerals (MULTIVITAMIN PO) Take by mouth daily.    . Pyridoxine HCl (VITAMIN B-6 PO) Take by mouth as needed.     Marland Kitchen UNABLE TO FIND Compounded throat gargle    . valACYclovir (VALTREX) 1000 MG tablet Take one for 3 days at onset of outbreak 30 tablet 12  . VITAMIN D, ERGOCALCIFEROL, PO Take 2,000 Int'l Units by mouth daily.      No current facility-administered medications for this visit.    Family History  Problem Relation Age of Onset  . Cancer Mother     uterine & colon  . Hypertension Mother   . Cancer Sister     lung  . Hypertension Sister  ROS:  Pertinent items are noted in HPI.  Otherwise, a comprehensive ROS was negative.  Exam:   BP 120/78 mmHg  Pulse 70  Resp 16  Ht 5' 2.5" (1.588 m)  Wt 182 lb (82.555 kg)  BMI 32.74 kg/m2  LMP 04/13/2004 Height: 5' 2.5" (158.8 cm) Ht Readings from Last 3 Encounters:  09/12/15 5' 2.5" (1.588 m)  09/05/14 5' 2.5" (1.588 m)  02/19/14 5\' 4"  (1.626 m)    General appearance: alert, cooperative and appears stated age Head: Normocephalic, without obvious abnormality, atraumatic Neck: no adenopathy, supple, symmetrical, trachea midline and thyroid normal to inspection and palpation Lungs: clear to auscultation bilaterally Breasts: normal appearance, no masses or tenderness, No nipple retraction or dimpling, No nipple discharge or bleeding, No axillary or supraclavicular adenopathy Heart: regular rate and rhythm Abdomen: soft, non-tender; no masses,  no organomegaly Extremities:  extremities normal, atraumatic, no cyanosis or edema Skin: Skin color, texture, turgor normal. No rashes or lesions Lymph nodes: Cervical, supraclavicular, and axillary nodes normal. No abnormal inguinal nodes palpated Neurologic: Grossly normal   Pelvic: External genitalia:  no lesions              Urethra:  normal appearing urethra with no masses, tenderness or lesions              Bartholin's and Skene's: normal                 Vagina: atrophic appearing vagina with normal color and scant discharge, no lesions, odor noted              Cervix: normal,non tender, no lesions              Pap taken: No. Bimanual Exam:  Uterus:  normal size, contour, position, consistency, mobility, non-tender              Adnexa: normal adnexa and no mass, fullness, tenderness               Rectovaginal: Confirms               Anus:  normal sphincter tone, no lesions  Chaperone present: yes  A:  Well Woman with normal exam  Menopausal no HRT  Previous history of LEEP for CIN1, History of Vain 1 greater than 10 years  Vaginal odor  R/O infection  Atrophic vaginitis  HSV needs refill for Valtrex  Screening labs  Hypertension/Asthma with PCP management   P:   Reviewed health and wellness pertinent to exam  Aware of need to advise if vaginal bleeding  Discussed odor and can be caused from consistent pad use or vaginal infection. Will treat per affirm if indicated  Discussed finding and etiology of atrophic vaginitis and can have increase discharge from dryness. Patient does not want any estrogen use. Discussed coconut oil option with instructions for nightly use given. Questions addressed. Will try for one month and recheck. Patient to advise if concerns during use.  Rx Valtrex see order  Labs:Hep.C, HIV,Vitamin D, Affirm  Continue follow up with PCP as indicated  Pap smear as above not taken   counseled on breast self exam, mammography screening, adequate intake of calcium and vitamin D, diet and  exercise  return annually or prn  An After Visit Summary was printed and given to the patient.

## 2015-09-13 LAB — HEPATITIS C ANTIBODY: HCV Ab: NEGATIVE

## 2015-09-13 LAB — WET PREP BY MOLECULAR PROBE
Candida species: NEGATIVE
GARDNERELLA VAGINALIS: NEGATIVE
TRICHOMONAS VAG: NEGATIVE

## 2015-09-13 LAB — VITAMIN D 25 HYDROXY (VIT D DEFICIENCY, FRACTURES): Vit D, 25-Hydroxy: 52 ng/mL (ref 30–100)

## 2015-09-13 LAB — HIV ANTIBODY (ROUTINE TESTING W REFLEX): HIV 1&2 Ab, 4th Generation: NONREACTIVE

## 2015-09-13 NOTE — Progress Notes (Signed)
Reviewed personally.  M. Suzanne Taylor Levick, MD.  

## 2016-01-09 ENCOUNTER — Other Ambulatory Visit: Payer: Self-pay | Admitting: Certified Nurse Midwife

## 2016-01-09 DIAGNOSIS — Z1231 Encounter for screening mammogram for malignant neoplasm of breast: Secondary | ICD-10-CM

## 2016-02-18 ENCOUNTER — Ambulatory Visit
Admission: RE | Admit: 2016-02-18 | Discharge: 2016-02-18 | Disposition: A | Discharge status: Home / Self Care | Payer: Commercial Managed Care - PPO | Source: Ambulatory Visit | Attending: Certified Nurse Midwife | Admitting: Certified Nurse Midwife

## 2016-02-18 DIAGNOSIS — Z1231 Encounter for screening mammogram for malignant neoplasm of breast: Secondary | ICD-10-CM

## 2016-04-15 DIAGNOSIS — N182 Chronic kidney disease, stage 2 (mild): Secondary | ICD-10-CM | POA: Diagnosis not present

## 2016-04-15 DIAGNOSIS — I129 Hypertensive chronic kidney disease with stage 1 through stage 4 chronic kidney disease, or unspecified chronic kidney disease: Secondary | ICD-10-CM | POA: Diagnosis not present

## 2016-04-15 DIAGNOSIS — E785 Hyperlipidemia, unspecified: Secondary | ICD-10-CM | POA: Diagnosis not present

## 2016-05-08 DIAGNOSIS — D485 Neoplasm of uncertain behavior of skin: Secondary | ICD-10-CM | POA: Diagnosis not present

## 2016-05-08 DIAGNOSIS — D0362 Melanoma in situ of left upper limb, including shoulder: Secondary | ICD-10-CM | POA: Diagnosis not present

## 2016-05-08 DIAGNOSIS — D1801 Hemangioma of skin and subcutaneous tissue: Secondary | ICD-10-CM | POA: Diagnosis not present

## 2016-05-08 DIAGNOSIS — D2262 Melanocytic nevi of left upper limb, including shoulder: Secondary | ICD-10-CM | POA: Diagnosis not present

## 2016-05-08 DIAGNOSIS — D2271 Melanocytic nevi of right lower limb, including hip: Secondary | ICD-10-CM | POA: Diagnosis not present

## 2016-05-08 DIAGNOSIS — D225 Melanocytic nevi of trunk: Secondary | ICD-10-CM | POA: Diagnosis not present

## 2016-05-14 HISTORY — PX: OTHER SURGICAL HISTORY: SHX169

## 2016-05-24 DIAGNOSIS — J069 Acute upper respiratory infection, unspecified: Secondary | ICD-10-CM | POA: Diagnosis not present

## 2016-05-28 DIAGNOSIS — L988 Other specified disorders of the skin and subcutaneous tissue: Secondary | ICD-10-CM | POA: Diagnosis not present

## 2016-05-28 DIAGNOSIS — D0362 Melanoma in situ of left upper limb, including shoulder: Secondary | ICD-10-CM | POA: Diagnosis not present

## 2016-07-24 DIAGNOSIS — H11002 Unspecified pterygium of left eye: Secondary | ICD-10-CM | POA: Diagnosis not present

## 2016-07-24 DIAGNOSIS — H5310 Unspecified subjective visual disturbances: Secondary | ICD-10-CM | POA: Diagnosis not present

## 2016-08-20 DIAGNOSIS — Z85820 Personal history of malignant melanoma of skin: Secondary | ICD-10-CM | POA: Diagnosis not present

## 2016-08-20 DIAGNOSIS — D2261 Melanocytic nevi of right upper limb, including shoulder: Secondary | ICD-10-CM | POA: Diagnosis not present

## 2016-08-20 DIAGNOSIS — D2262 Melanocytic nevi of left upper limb, including shoulder: Secondary | ICD-10-CM | POA: Diagnosis not present

## 2016-09-16 ENCOUNTER — Ambulatory Visit (INDEPENDENT_AMBULATORY_CARE_PROVIDER_SITE_OTHER): Payer: Commercial Managed Care - PPO | Admitting: Certified Nurse Midwife

## 2016-09-16 ENCOUNTER — Other Ambulatory Visit (HOSPITAL_COMMUNITY)
Admission: RE | Admit: 2016-09-16 | Discharge: 2016-09-16 | Disposition: A | Discharge status: Home / Self Care | Payer: Commercial Managed Care - PPO | Source: Ambulatory Visit | Attending: Obstetrics & Gynecology | Admitting: Obstetrics & Gynecology

## 2016-09-16 ENCOUNTER — Encounter: Payer: Self-pay | Admitting: Certified Nurse Midwife

## 2016-09-16 VITALS — BP 116/72 | HR 68 | Resp 16 | Ht 62.5 in | Wt 183.0 lb

## 2016-09-16 DIAGNOSIS — Z124 Encounter for screening for malignant neoplasm of cervix: Secondary | ICD-10-CM

## 2016-09-16 DIAGNOSIS — N952 Postmenopausal atrophic vaginitis: Secondary | ICD-10-CM | POA: Diagnosis not present

## 2016-09-16 DIAGNOSIS — Z01419 Encounter for gynecological examination (general) (routine) without abnormal findings: Secondary | ICD-10-CM | POA: Insufficient documentation

## 2016-09-16 DIAGNOSIS — Z0000 Encounter for general adult medical examination without abnormal findings: Secondary | ICD-10-CM

## 2016-09-16 LAB — POCT URINALYSIS DIPSTICK
Bilirubin, UA: NEGATIVE
GLUCOSE UA: NEGATIVE
Ketones, UA: NEGATIVE
Leukocytes, UA: NEGATIVE
Nitrite, UA: NEGATIVE
PH UA: 5 (ref 5.0–8.0)
Protein, UA: NEGATIVE
RBC UA: NEGATIVE
Urobilinogen, UA: NEGATIVE E.U./dL — AB

## 2016-09-16 NOTE — Progress Notes (Signed)
61 y.o. G1P0010 Divorced  Caucasian Fe here for annual exam. Menopausal no HRT. Denies vaginal bleeding. Vaginal dryness being treated with OTC Replens with good results. Sees Dr. Noah Delaine PCP twice yearly for hypertension/asthma/cholesterol medication management, labs and aex. Having slight hip pain after trip to [place]. Feels after all the walking started. Feeling better now. No other health issues today.                 Patient's last menstrual period was 04/13/2004.          Sexually active: No.  The current method of family planning is tubal ligation.    Exercising: Yes.    gym Smoker:  no  Health Maintenance: Pap:  09-05-14 neg HPV HR neg History of Abnormal Pap: yes LEEP MMG:  02-28-16 category a density birads 1:neg Self Breast exams: occ Colonoscopy:  7/14, f/u 82yrs BMD:   11/16 normal TDaP:  2009 Shingles: 2015, had swelling in arm Pneumonia: not done Hep C and HIV: both neg 2017 Labs: none   reports that she has never smoked. She has never used smokeless tobacco. She reports that she drinks alcohol. She reports that she does not use drugs.  Past Medical History:  Diagnosis Date  . Cancer ([place])    melanoma lower left leg, left arm  . Difficult intubation    took 3 tries with nerve block  . Fibroid    5 cm  . Hyperlipidemia   . Hypertension   . IBS (irritable bowel syndrome)   . Shingles     Past Surgical History:  Procedure Laterality Date  . CARPAL TUNNEL RELEASE  2004   left & right  . CERVICAL BIOPSY  W/ LOOP ELECTRODE EXCISION     CIN1  . COLPOSCOPY     VAIN1  . melanoma removal  05/2016   left arm  . ROTATOR CUFF REPAIR Right 2012  . TONSILLECTOMY    . TUBAL LIGATION      Current Outpatient Prescriptions  Medication Sig Dispense Refill  . atorvastatin (LIPITOR) 10 MG tablet Take 10 mg by mouth daily.    . Coenzyme Q10 (CO Q 10 PO) Take by mouth daily.    . Cyanocobalamin (VITAMIN B-12 PO) Take by mouth daily.    . fluticasone (FLONASE) 50 MCG/ACT  nasal spray     . Ibuprofen (ADVIL PO) Take by mouth as needed.    Marland Kitchen losartan-hydrochlorothiazide (HYZAAR) 50-12.5 MG per tablet daily.    . montelukast (SINGULAIR) 10 MG tablet as needed.    . Multiple Vitamins-Minerals (MULTIVITAMIN PO) Take by mouth daily.    . valACYclovir (VALTREX) 1000 MG tablet Take one for 3 days at onset of outbreak 30 tablet 12  . VITAMIN D, ERGOCALCIFEROL, PO Take 2,000 Int'l Units by mouth daily.      No current facility-administered medications for this visit.     Family History  Problem Relation Age of Onset  . Cancer Mother        uterine & colon  . Hypertension Mother   . Cancer Sister        lung  . Hypertension Sister     ROS:  Pertinent items are noted in HPI.  Otherwise, a comprehensive ROS was negative.  Exam:   BP 116/72   Pulse 68   Resp 16   Ht 5' 2.5" (1.588 m)   Wt 183 lb (83 kg)   LMP 04/13/2004   BMI 32.94 kg/m  Height: 5' 2.5" (158.8 cm)  Ht Readings from Last 3 Encounters:  09/16/16 5' 2.5" (1.588 m)  09/12/15 5' 2.5" (1.588 m)  09/05/14 5' 2.5" (1.588 m)    General appearance: alert, cooperative and appears stated age Head: Normocephalic, without obvious abnormality, atraumatic Neck: no adenopathy, supple, symmetrical, trachea midline and thyroid normal to inspection and palpation Lungs: clear to auscultation bilaterally Breasts: normal appearance, no masses or tenderness, No nipple retraction or dimpling, No nipple discharge or bleeding, No axillary or supraclavicular adenopathy Heart: regular rate and rhythm Abdomen: soft, non-tender; no masses,  no organomegaly Extremities: extremities normal, atraumatic, no cyanosis or edema Skin: Skin color, texture, turgor normal. No rashes or lesions Lymph nodes: Cervical, supraclavicular, and axillary nodes normal. No abnormal inguinal nodes palpated Neurologic: Grossly normal   Pelvic: External genitalia:  no lesions              Urethra:  normal appearing urethra with no  masses, tenderness or lesions              Bartholin's and Skene's: normal                 Vagina: atrophic appearing vagina with pall color and discharge, no lesions              Cervix: no cervical motion tenderness, no lesions and normal appearance              Pap taken: Yes.   Bimanual Exam:  Uterus:  normal size, contour, position, consistency, mobility, non-tender              Adnexa: normal adnexa and no mass, fullness, tenderness               Rectovaginal: Confirms               Anus:  normal sphincter tone,hemorrhoidal tags noted  Chaperone present: yes  A:  Well Woman with normal exam  Menopausal no HRT  Atrophic vaginitis  Hypertension/cholesterol/Vitamin D management with PCP  History of HSV oral, no outbreaks no Rx needed   P:   Reviewed health and wellness pertinent to exam  Aware of need to evaluate if vaginal bleeding  Discussed finding and etiology of atrophic vaginitis and increase risk of vaginal infection and UTI if not treated. Discussed options of Estrogen cream or OTC options. Patient does not want any estrogen product. Discussed coconut oil, Olive oil, Replens. Patient will do trial of coconut oil and advise if no change or problems. Questions addressed.  Continue follow up with PCP as indicated  Will advise if Rx for Valtrex needed.  Pap smear: yes   counseled on breast self exam, mammography screening, feminine hygiene, adequate intake of calcium and vitamin D, diet and exercise, Kegel's exercises  return annually or prn  An After Visit Summary was printed and given to the patient.

## 2016-09-16 NOTE — Patient Instructions (Signed)
EXERCISE AND DIET:  We recommended that you start or continue a regular exercise program for good health. Regular exercise means any activity that makes your heart beat faster and makes you sweat.  We recommend exercising at least 30 minutes per day at least 3 days a week, preferably 4 or 5.  We also recommend a diet low in fat and sugar.  Inactivity, poor dietary choices and obesity can cause diabetes, heart attack, stroke, and kidney damage, among others.    ALCOHOL AND SMOKING:  Women should limit their alcohol intake to no more than 7 drinks/beers/glasses of wine (combined, not each!) per week. Moderation of alcohol intake to this level decreases your risk of breast cancer and liver damage. And of course, no recreational drugs are part of a healthy lifestyle.  And absolutely no smoking or even second hand smoke. Most people know smoking can cause heart and lung diseases, but did you know it also contributes to weakening of your bones? Aging of your skin?  Yellowing of your teeth and nails?  CALCIUM AND VITAMIN D:  Adequate intake of calcium and Vitamin D are recommended.  The recommendations for exact amounts of these supplements seem to change often, but generally speaking 600 mg of calcium (either carbonate or citrate) and 800 units of Vitamin D per day seems prudent. Certain women may benefit from higher intake of Vitamin D.  If you are among these women, your doctor will have told you during your visit.    PAP SMEARS:  Pap smears, to check for cervical cancer or precancers,  have traditionally been done yearly, although recent scientific advances have shown that most women can have pap smears less often.  However, every woman still should have a physical exam from her gynecologist every year. It will include a breast check, inspection of the vulva and vagina to check for abnormal growths or skin changes, a visual exam of the cervix, and then an exam to evaluate the size and shape of the uterus and  ovaries.  And after 63 years of age, a rectal exam is indicated to check for rectal cancers. We will also provide age appropriate advice regarding health maintenance, like when you should have certain vaccines, screening for sexually transmitted diseases, bone density testing, colonoscopy, mammograms, etc.   MAMMOGRAMS:  All women over 40 years old should have a yearly mammogram. Many facilities now offer a "3D" mammogram, which may cost around $50 extra out of pocket. If possible,  we recommend you accept the option to have the 3D mammogram performed.  It both reduces the number of women who will be called back for extra views which then turn out to be normal, and it is better than the routine mammogram at detecting truly abnormal areas.    COLONOSCOPY:  Colonoscopy to screen for colon cancer is recommended for all women at age 50.  We know, you hate the idea of the prep.  We agree, BUT, having colon cancer and not knowing it is worse!!  Colon cancer so often starts as a polyp that can be seen and removed at colonscopy, which can quite literally save your life!  And if your first colonoscopy is normal and you have no family history of colon cancer, most women don't have to have it again for 10 years.  Once every ten years, you can do something that may end up saving your life, right?  We will be happy to help you get it scheduled when you are ready.    Be sure to check your insurance coverage so you understand how much it will cost.  It may be covered as a preventative service at no cost, but you should check your particular policy.      Atrophic Vaginitis Atrophic vaginitis is when the tissues that line the vagina become dry and thin. This is caused by a drop in estrogen. Estrogen helps:  To keep the vagina moist.  To make a clear fluid that helps: ? To lubricate the vagina for sex. ? To protect the vagina from infection.  If the lining of the vagina is dry and thin, it may:  Make sex painful. It  may also cause bleeding.  Cause a feeling of: ? Burning. ? Irritation. ? Itchiness.  Make an exam of your vagina painful. It may also cause bleeding.  Make you lose interest in sex.  Cause a burning feeling when you pee.  Make your vaginal fluid (discharge) brown or yellow.  For some women, there are no symptoms. This condition is most common in women who do not get their regular menstrual periods anymore (menopause). This often starts when a woman is 45-55 years old. Follow these instructions at home:  Take medicines only as told by your doctor. Do not use any herbal or alternative medicines unless your doctor says it is okay.  Use over-the-counter products for dryness only as told by your doctor. These include: ? Creams. ? Lubricants. ? Moisturizers.  Do not douche.  Do not use products that can make your vagina dry. These include: ? Scented feminine sprays. ? Scented tampons. ? Scented soaps.  If it hurts to have sex, tell your sexual partner. Contact a doctor if:  Your discharge looks different than normal.  Your vagina has an unusual smell.  You have new symptoms.  Your symptoms do not get better with treatment.  Your symptoms get worse. This information is not intended to replace advice given to you by your health care provider. Make sure you discuss any questions you have with your health care provider. Document Released: 09/16/2007 Document Revised: 09/05/2015 Document Reviewed: 03/21/2014 Elsevier Interactive Patient Education  2018 [employer].  

## 2016-09-17 LAB — CYTOLOGY - PAP: Diagnosis: NEGATIVE

## 2016-09-28 DIAGNOSIS — H11002 Unspecified pterygium of left eye: Secondary | ICD-10-CM | POA: Diagnosis not present

## 2016-10-13 DIAGNOSIS — E559 Vitamin D deficiency, unspecified: Secondary | ICD-10-CM | POA: Diagnosis not present

## 2016-10-13 DIAGNOSIS — Z0000 Encounter for general adult medical examination without abnormal findings: Secondary | ICD-10-CM | POA: Diagnosis not present

## 2016-10-13 DIAGNOSIS — E785 Hyperlipidemia, unspecified: Secondary | ICD-10-CM | POA: Diagnosis not present

## 2016-10-20 DIAGNOSIS — Z0000 Encounter for general adult medical examination without abnormal findings: Secondary | ICD-10-CM | POA: Diagnosis not present

## 2016-10-20 DIAGNOSIS — D709 Neutropenia, unspecified: Secondary | ICD-10-CM | POA: Diagnosis not present

## 2016-10-20 DIAGNOSIS — N182 Chronic kidney disease, stage 2 (mild): Secondary | ICD-10-CM | POA: Diagnosis not present

## 2016-12-10 DIAGNOSIS — D224 Melanocytic nevi of scalp and neck: Secondary | ICD-10-CM | POA: Diagnosis not present

## 2016-12-10 DIAGNOSIS — L570 Actinic keratosis: Secondary | ICD-10-CM | POA: Diagnosis not present

## 2016-12-10 DIAGNOSIS — L821 Other seborrheic keratosis: Secondary | ICD-10-CM | POA: Diagnosis not present

## 2016-12-10 DIAGNOSIS — D485 Neoplasm of uncertain behavior of skin: Secondary | ICD-10-CM | POA: Diagnosis not present

## 2016-12-10 DIAGNOSIS — L918 Other hypertrophic disorders of the skin: Secondary | ICD-10-CM | POA: Diagnosis not present

## 2016-12-23 DIAGNOSIS — H11002 Unspecified pterygium of left eye: Secondary | ICD-10-CM | POA: Diagnosis not present

## 2016-12-23 DIAGNOSIS — L722 Steatocystoma multiplex: Secondary | ICD-10-CM | POA: Diagnosis not present

## 2016-12-23 DIAGNOSIS — H11052 Peripheral pterygium, progressive, left eye: Secondary | ICD-10-CM | POA: Diagnosis not present

## 2017-01-29 ENCOUNTER — Other Ambulatory Visit: Payer: Self-pay | Admitting: Certified Nurse Midwife

## 2017-01-29 DIAGNOSIS — H2513 Age-related nuclear cataract, bilateral: Secondary | ICD-10-CM | POA: Diagnosis not present

## 2017-01-29 DIAGNOSIS — Z1231 Encounter for screening mammogram for malignant neoplasm of breast: Secondary | ICD-10-CM

## 2017-02-18 ENCOUNTER — Ambulatory Visit
Admission: RE | Admit: 2017-02-18 | Discharge: 2017-02-18 | Disposition: A | Discharge status: Home / Self Care | Payer: Commercial Managed Care - PPO | Source: Ambulatory Visit | Attending: Certified Nurse Midwife | Admitting: Certified Nurse Midwife

## 2017-02-18 DIAGNOSIS — Z1231 Encounter for screening mammogram for malignant neoplasm of breast: Secondary | ICD-10-CM | POA: Diagnosis not present

## 2017-02-19 DIAGNOSIS — D2262 Melanocytic nevi of left upper limb, including shoulder: Secondary | ICD-10-CM | POA: Diagnosis not present

## 2017-02-19 DIAGNOSIS — D2261 Melanocytic nevi of right upper limb, including shoulder: Secondary | ICD-10-CM | POA: Diagnosis not present

## 2017-02-19 DIAGNOSIS — D225 Melanocytic nevi of trunk: Secondary | ICD-10-CM | POA: Diagnosis not present

## 2017-02-19 DIAGNOSIS — L570 Actinic keratosis: Secondary | ICD-10-CM | POA: Diagnosis not present

## 2017-03-26 DIAGNOSIS — J209 Acute bronchitis, unspecified: Secondary | ICD-10-CM | POA: Diagnosis not present

## 2017-04-08 ENCOUNTER — Ambulatory Visit: Payer: Commercial Managed Care - PPO | Admitting: Family Medicine

## 2017-04-17 DIAGNOSIS — Z0100 Encounter for examination of eyes and vision without abnormal findings: Secondary | ICD-10-CM | POA: Diagnosis not present

## 2017-04-22 ENCOUNTER — Other Ambulatory Visit: Payer: Self-pay

## 2017-04-22 ENCOUNTER — Ambulatory Visit: Payer: Commercial Managed Care - PPO | Admitting: Family Medicine

## 2017-04-22 ENCOUNTER — Encounter: Payer: Self-pay | Admitting: Family Medicine

## 2017-04-22 DIAGNOSIS — F411 Generalized anxiety disorder: Secondary | ICD-10-CM | POA: Diagnosis not present

## 2017-04-22 DIAGNOSIS — C439 Malignant melanoma of skin, unspecified: Secondary | ICD-10-CM | POA: Diagnosis not present

## 2017-04-22 DIAGNOSIS — J309 Allergic rhinitis, unspecified: Secondary | ICD-10-CM | POA: Insufficient documentation

## 2017-04-22 DIAGNOSIS — J301 Allergic rhinitis due to pollen: Secondary | ICD-10-CM

## 2017-04-22 DIAGNOSIS — R21 Rash and other nonspecific skin eruption: Secondary | ICD-10-CM | POA: Diagnosis not present

## 2017-04-22 DIAGNOSIS — E7800 Pure hypercholesterolemia, unspecified: Secondary | ICD-10-CM | POA: Diagnosis not present

## 2017-04-22 DIAGNOSIS — I10 Essential (primary) hypertension: Secondary | ICD-10-CM

## 2017-04-22 MED ORDER — TRIAMCINOLONE ACETONIDE 0.5 % EX CREA: 1.0000 "application " | TOPICAL_CREAM | Freq: Two times a day (BID) | CUTANEOUS | 0 refills | Status: DC

## 2017-04-22 NOTE — Assessment & Plan Note (Signed)
Followed by Derm Dr. Ubaldo Glassing.

## 2017-04-22 NOTE — Assessment & Plan Note (Signed)
Likely contact derm vs atopic derm.  treat with topical triamcinolone.

## 2017-04-22 NOTE — Assessment & Plan Note (Signed)
Good  Control on atorvastatin 10 mg daily.

## 2017-04-22 NOTE — Assessment & Plan Note (Signed)
Well controlled on Singulair.

## 2017-04-22 NOTE — Assessment & Plan Note (Signed)
Well controlled. Continue current medication.  

## 2017-04-22 NOTE — Patient Instructions (Addendum)
We will call once we get records from Dr. Noah Delaine about what  anxiety med you were on.. If you don't hear from [place] call.  Use the cream twice daily x 2 week.. Call if not improving.

## 2017-04-22 NOTE — Progress Notes (Signed)
Subjective:    Patient ID: Beverly Quinn, female    DOB: 05-10-1955, 62 y.o.   MRN: [id number]  HPI    62 year old female presents to establish care.   Last PCP: Dr. Trilby Drummer was followed every 6 months.  Last CPX: 09/2016 by GYN  Has GYN Melvia Heaps CNM   Colonoscopy; Dr. Cristina Gong, last 09/2012, every 5 years.. Due in 2019  Hypertension:  Good control on losartan HCTZ  Using medication without problems or lightheadedness: none Chest pain with exertion:none Edema: none Short of breath:none Average home BPs: good Other issues: occ heart palpitations and anxiety associated with work stress.  Elevated Cholesterol: Good control on atorvastatin at last check in 09/2016 Using medications without problems: none Muscle aches: none Diet compliance: Good Exercise: 2-3 times a week  Other complaints:  resh on left forearm.Marland Kitchen icthy dry skin.. No improvement with cortisone.. Started after wearing bracelet.  Melanoma: resection 2001, 2018  Followed by Derm Dr. Ubaldo Glassing.  Situational anxiety: Increased stress at work since November... Using 1/4 of a 0.5 mg tab 2 days a week.   PHQ9:3  GAD7:7     Social History /Family History/Past Medical History reviewed in detail and updated in EMR if needed. Blood pressure 136/84, pulse 69, temperature 98.4 F (36.9 C), temperature source Oral, height 5' 2.5" (1.588 m), weight 191 lb 4 oz (86.8 kg), last menstrual period 04/13/2004.   Review of Systems  Constitutional: Negative for fatigue and fever.  HENT: Negative for congestion.   Eyes: Negative for pain.  Respiratory: Negative for cough and shortness of breath.   Cardiovascular: Negative for chest pain, palpitations and leg swelling.  Gastrointestinal: Negative for abdominal pain.  Genitourinary: Negative for dysuria and vaginal bleeding.  Musculoskeletal: Negative for back pain.  Neurological: Negative for syncope, light-headedness and headaches.  Psychiatric/Behavioral:  Negative for dysphoric mood. The patient is nervous/anxious.        Objective:   Physical Exam  Constitutional: Vital signs are normal. She appears well-developed and well-nourished. She is cooperative.  Non-toxic appearance. She does not appear ill. No distress.  HENT:  Head: Normocephalic.  Right Ear: Hearing, tympanic membrane, external ear and ear canal normal.  Left Ear: Hearing, tympanic membrane, external ear and ear canal normal.  Nose: Nose normal.  Eyes: Conjunctivae, EOM and lids are normal. Pupils are equal, round, and reactive to light. Lids are everted and swept, no foreign bodies found.  Neck: Trachea normal and normal range of motion. Neck supple. Carotid bruit is not present. No thyroid mass and no thyromegaly present.  Cardiovascular: Normal rate, regular rhythm, S1 normal, S2 normal, normal heart sounds and intact distal pulses. Exam reveals no gallop.  No murmur heard. Pulmonary/Chest: Effort normal and breath sounds normal. No respiratory distress. She has no wheezes. She has no rhonchi. She has no rales.  Abdominal: Soft. Normal appearance and bowel sounds are normal. She exhibits no distension, no fluid wave, no abdominal bruit and no mass. There is no hepatosplenomegaly. There is no tenderness. There is no rebound, no guarding and no CVA tenderness. No hernia.  Lymphadenopathy:    She has no cervical adenopathy.    She has no axillary adenopathy.  Neurological: She is alert. She has normal strength. No cranial nerve deficit or sensory deficit.  Skin: Skin is warm, dry and intact. No rash noted.   4 cm erythematous dry flaky skin patch NO central clearing on left forearm  Psychiatric: Her speech is normal and  behavior is normal. Judgment normal. Her mood appears not anxious. Cognition and memory are normal. She does not exhibit a depressed mood.          Assessment & Plan:

## 2017-04-22 NOTE — Assessment & Plan Note (Signed)
Get record of anxiety med she used in past.. She is interested in starting this med again given it helped and she tolerated it.

## 2017-05-05 DIAGNOSIS — D2262 Melanocytic nevi of left upper limb, including shoulder: Secondary | ICD-10-CM | POA: Diagnosis not present

## 2017-05-05 DIAGNOSIS — L309 Dermatitis, unspecified: Secondary | ICD-10-CM | POA: Diagnosis not present

## 2017-05-05 DIAGNOSIS — D485 Neoplasm of uncertain behavior of skin: Secondary | ICD-10-CM | POA: Diagnosis not present

## 2017-05-05 DIAGNOSIS — L821 Other seborrheic keratosis: Secondary | ICD-10-CM | POA: Diagnosis not present

## 2017-05-05 DIAGNOSIS — D225 Melanocytic nevi of trunk: Secondary | ICD-10-CM | POA: Diagnosis not present

## 2017-05-11 ENCOUNTER — Telehealth: Payer: Self-pay | Admitting: Family Medicine

## 2017-05-11 ENCOUNTER — Ambulatory Visit: Payer: Self-pay | Admitting: *Deleted

## 2017-05-11 NOTE — Telephone Encounter (Signed)
Message from Dr. Diona Browner read to patient. Pt will hold off on Lexapro as she is feeling better.  Will call if symptoms worsen.

## 2017-05-11 NOTE — Telephone Encounter (Signed)
Let pt know.. I reviewed her old records .Beverly Quinn It looks like she was on Lexapro ( ecitalopram ) in the past for her anxiety... If sh is continuing to have issues.. We can restart it. Goal would be able to decrease use of clonazepam.

## 2017-05-11 NOTE — Telephone Encounter (Signed)
Left message for Beverly Quinn to return call to office.  Ok to [facility] Triage Nurse to give patient information when she calls back.

## 2017-05-12 ENCOUNTER — Encounter: Payer: Self-pay | Admitting: Family Medicine

## 2017-06-04 ENCOUNTER — Other Ambulatory Visit: Payer: Self-pay | Admitting: *Deleted

## 2017-06-04 MED ORDER — LOSARTAN POTASSIUM-HCTZ 50-12.5 MG PO TABS: 1.0000 | ORAL_TABLET | Freq: Every day | ORAL | 1 refills | Status: DC

## 2017-06-24 ENCOUNTER — Telehealth: Payer: Self-pay | Admitting: *Deleted

## 2017-06-24 MED ORDER — LOSARTAN POTASSIUM 50 MG PO TABS: 50.0000 mg | ORAL_TABLET | Freq: Every day | ORAL | 1 refills | Status: DC

## 2017-06-24 MED ORDER — HYDROCHLOROTHIAZIDE 12.5 MG PO CAPS: 12.5000 mg | ORAL_CAPSULE | Freq: Every day | ORAL | 1 refills | Status: DC

## 2017-06-24 MED ORDER — HYDROCHLOROTHIAZIDE 25 MG PO TABS: 25.0000 mg | ORAL_TABLET | Freq: Every day | ORAL | 1 refills | Status: DC

## 2017-06-24 MED ORDER — LOSARTAN POTASSIUM 100 MG PO TABS: 100.0000 mg | ORAL_TABLET | Freq: Every day | ORAL | 1 refills | Status: DC

## 2017-06-24 NOTE — Telephone Encounter (Signed)
Received fax from [facility] stating Losartan-HCTZ 50-12.5 mg is on backorder.  Requesting alternative.  Prescriptions sent in for separate medications Losartan 50 mg and HCTZ 12.5 mg.

## 2017-09-21 ENCOUNTER — Other Ambulatory Visit: Payer: Self-pay

## 2017-09-21 ENCOUNTER — Ambulatory Visit (INDEPENDENT_AMBULATORY_CARE_PROVIDER_SITE_OTHER): Payer: Commercial Managed Care - PPO | Admitting: Certified Nurse Midwife

## 2017-09-21 ENCOUNTER — Other Ambulatory Visit (HOSPITAL_COMMUNITY)
Admission: RE | Admit: 2017-09-21 | Discharge: 2017-09-21 | Disposition: A | Discharge status: Home / Self Care | Payer: Commercial Managed Care - PPO | Source: Ambulatory Visit | Attending: Certified Nurse Midwife | Admitting: Certified Nurse Midwife

## 2017-09-21 ENCOUNTER — Encounter: Payer: Self-pay | Admitting: Certified Nurse Midwife

## 2017-09-21 VITALS — BP 110/70 | HR 68 | Resp 16 | Ht 62.75 in | Wt 189.0 lb

## 2017-09-21 DIAGNOSIS — Z1151 Encounter for screening for human papillomavirus (HPV): Secondary | ICD-10-CM | POA: Insufficient documentation

## 2017-09-21 DIAGNOSIS — Z87898 Personal history of other specified conditions: Secondary | ICD-10-CM | POA: Diagnosis not present

## 2017-09-21 DIAGNOSIS — Z124 Encounter for screening for malignant neoplasm of cervix: Secondary | ICD-10-CM

## 2017-09-21 DIAGNOSIS — B009 Herpesviral infection, unspecified: Secondary | ICD-10-CM | POA: Diagnosis not present

## 2017-09-21 DIAGNOSIS — Z8742 Personal history of other diseases of the female genital tract: Secondary | ICD-10-CM

## 2017-09-21 DIAGNOSIS — Z01419 Encounter for gynecological examination (general) (routine) without abnormal findings: Secondary | ICD-10-CM | POA: Diagnosis not present

## 2017-09-21 MED ORDER — VALACYCLOVIR HCL 1 G PO TABS: ORAL_TABLET | ORAL | 12 refills | Status: DC

## 2017-09-21 NOTE — Progress Notes (Signed)
62 y.o. G3P0010 Divorced  Caucasian Fe here for annual exam. Menopausal no HRT. Denies vaginal bleeding or vaginal dryness. Using coconut oil for vaginal dryness with good results. Changed to  [facility] for aex and labs, asthma, hypertension, cholesterol and Klonipin medication. management. All stable per patient. Urinary frequency/urgency better with HCTZ use less frequently. Sees dermatology yearly due to history of Melanoma. Using essential oils for stress relief with company changing issues. No outbreaks of HSV, but need Rx update.  Patient's last menstrual period was 04/13/2004.          Sexually active: No.  The current method of family planning is tubal ligation.    Exercising: Yes.    gym Smoker:  no  Health Maintenance: Pap:  09-16-16 neg,no endos due to atrophy History of Abnormal Pap: yes LEEP MMG:  02-18-17 category b density birads 1:neg Self Breast exams: occ Colonoscopy:  7.14 f/u 16yrs BMD:   11/16 normal TDaP:  2009 Shingles: 2015, had swelling in arm Pneumonia: not done Hep C and HIV: both neg 2017 Labs: only if needed   reports that she has never smoked. She has never used smokeless tobacco. She reports that she drinks alcohol. She reports that she does not use drugs.  Past Medical History:  Diagnosis Date  . Cancer ([place])    melanoma lower left leg, left arm  . Difficult intubation    took 3 tries with nerve block  . Fibroid    5 cm  . Hyperlipidemia   . Hypertension   . IBS (irritable bowel syndrome)   . Shingles     Past Surgical History:  Procedure Laterality Date  . CARPAL TUNNEL RELEASE  2004   left & right  . CERVICAL BIOPSY  W/ LOOP ELECTRODE EXCISION     CIN1  . COLPOSCOPY     VAIN1  . melanoma removal  05/2016   left arm  . ROTATOR CUFF REPAIR Right 2012  . TONSILLECTOMY    . TUBAL LIGATION      Current Outpatient Medications  Medication Sig Dispense Refill  . atorvastatin (LIPITOR) 10 MG tablet Take 10 mg by mouth daily.    . Coenzyme  Q10 (CO Q 10 PO) Take 1 tablet by mouth. 4 times a week    . Cyanocobalamin (VITAMIN B-12 PO) Take 1 tablet by mouth daily.     . fluticasone (FLONASE) 50 MCG/ACT nasal spray Place 2 sprays into both nostrils daily as needed.     . hydrochlorothiazide (MICROZIDE) 12.5 MG capsule Take 1 capsule (12.5 mg total) by mouth daily. (Patient taking differently: Take 12.5 mg by mouth every other day. ) 90 capsule 1  . Ibuprofen (ADVIL PO) Take by mouth as needed.    Marland Kitchen losartan (COZAAR) 50 MG tablet Take 1 tablet (50 mg total) by mouth daily. 90 tablet 1  . montelukast (SINGULAIR) 10 MG tablet Take 10 mg by mouth daily as needed.     . Multiple Vitamins-Minerals (MULTIVITAMIN PO) Take by mouth daily.    . valACYclovir (VALTREX) 1000 MG tablet Take one for 3 days at onset of outbreak 30 tablet 12  . VITAMIN D, ERGOCALCIFEROL, PO Take 1,000 Int'l Units by mouth daily.     . clonazePAM (KLONOPIN) 0.5 MG tablet Take 0.5 mg by mouth daily as needed for anxiety.     No current facility-administered medications for this visit.     Family History  Problem Relation Age of Onset  . Cancer Mother  uterine & colon  . Hypertension Mother   . Cancer Sister        lung  . Hypertension Sister     ROS:  Pertinent items are noted in HPI.  Otherwise, a comprehensive ROS was negative.  Exam:   BP 110/70   Pulse 68   Resp 16   Ht 5' 2.75" (1.594 m)   Wt 189 lb (85.7 kg)   LMP 04/13/2004   BMI 33.75 kg/m  Height: 5' 2.75" (159.4 cm) Ht Readings from Last 3 Encounters:  09/21/17 5' 2.75" (1.594 m)  04/22/17 5' 2.5" (1.588 m)  09/16/16 5' 2.5" (1.588 m)    General appearance: alert, cooperative and appears stated age Head: Normocephalic, without obvious abnormality, atraumatic Neck: no adenopathy, supple, symmetrical, trachea midline and thyroid normal to inspection and palpation Lungs: clear to auscultation bilaterally Breasts: normal appearance, no masses or tenderness, No nipple retraction or  dimpling, No nipple discharge or bleeding, No axillary or supraclavicular adenopathy Heart: regular rate and rhythm Abdomen: soft, non-tender; no masses,  no organomegaly Extremities: extremities normal, atraumatic, no cyanosis or edema Skin: Skin color, texture, turgor normal. No rashes or lesions Lymph nodes: Cervical, supraclavicular, and axillary nodes normal. No abnormal inguinal nodes palpated Neurologic: Grossly normal   Pelvic: External genitalia:  no lesions              Urethra:  normal appearing urethra with no masses, tenderness or lesions              Bartholin's and Skene's: normal                 Vagina: normal appearing vagina with normal color and discharge, no lesions              Cervix: no bleeding following Pap, no cervical motion tenderness and no lesions              Pap taken: Yes.   Bimanual Exam:  Uterus:  normal size, contour, position, consistency, mobility, non-tender and anteverted              Adnexa: normal adnexa and no mass, fullness, tenderness               Rectovaginal: Confirms               Anus:  normal sphincter tone, no lesions  Chaperone present: yes  A:  Well Woman with normal exam  Post menopausal  Vaginal dryness improved with coconut oil use  Cholesterol,hypertension, asthma, labs, immunization with PCP management ( next  Month)  History of LEEP  History of HSV  Immunization update  Colonoscopy due  P:   Reviewed health and wellness pertinent to exam  Aware of need to evaluate if vaginal bleeding  Continue use as discussed.  Continue follow up with PCP as indicated for medication management  Rx Valtrex see order with instructions  Aware TDAP due plans with PCP  Aware will be scheduling later this summer  Pap smear: yes   counseled on breast self exam, mammography screening, feminine hygiene, adequate intake of calcium and vitamin D, diet and exercise, Kegel's exercises  return annually or prn  An After Visit Summary was  printed and given to the patient.

## 2017-09-21 NOTE — Patient Instructions (Signed)

## 2017-09-23 LAB — CYTOLOGY - PAP
Diagnosis: NEGATIVE
HPV: NOT DETECTED

## 2017-10-19 ENCOUNTER — Telehealth: Payer: Self-pay | Admitting: Family Medicine

## 2017-10-19 ENCOUNTER — Other Ambulatory Visit (INDEPENDENT_AMBULATORY_CARE_PROVIDER_SITE_OTHER): Payer: Commercial Managed Care - PPO

## 2017-10-19 DIAGNOSIS — E7800 Pure hypercholesterolemia, unspecified: Secondary | ICD-10-CM

## 2017-10-19 LAB — COMPREHENSIVE METABOLIC PANEL
ALK PHOS: 66 U/L (ref 39–117)
ALT: 25 U/L (ref 0–35)
AST: 21 U/L (ref 0–37)
Albumin: 4.3 g/dL (ref 3.5–5.2)
BUN: 12 mg/dL (ref 6–23)
CHLORIDE: 102 meq/L (ref 96–112)
CO2: 32 mEq/L (ref 19–32)
Calcium: 9.6 mg/dL (ref 8.4–10.5)
Creatinine, Ser: 0.81 mg/dL (ref 0.40–1.20)
GFR: 75.6 mL/min (ref >60.00)
GLUCOSE: 101 mg/dL — AB (ref 70–99)
POTASSIUM: 4.2 meq/L (ref 3.5–5.1)
SODIUM: 140 meq/L (ref 135–145)
TOTAL PROTEIN: 6.4 g/dL (ref 6.0–8.3)
Total Bilirubin: 0.6 mg/dL (ref 0.2–1.2)

## 2017-10-19 LAB — LIPID PANEL
Cholesterol: 172 mg/dL (ref 0–200)
HDL: 65.1 mg/dL (ref >39.00)
LDL Cholesterol: 85 mg/dL (ref 0–99)
NonHDL: 107.29
TRIGLYCERIDES: 111 mg/dL (ref 0.0–149.0)
Total CHOL/HDL Ratio: 3
VLDL: 22.2 mg/dL (ref 0.0–40.0)

## 2017-10-19 NOTE — Telephone Encounter (Signed)
-----   Message from Ellamae Sia sent at 10/06/2017 10:35 AM EDT ----- Regarding: Lab orders for Tuesday, 7.9.19 Lab orders for a 6 month follow up appt

## 2017-10-22 ENCOUNTER — Ambulatory Visit (INDEPENDENT_AMBULATORY_CARE_PROVIDER_SITE_OTHER): Payer: Commercial Managed Care - PPO | Admitting: Family Medicine

## 2017-10-22 ENCOUNTER — Other Ambulatory Visit: Payer: Self-pay

## 2017-10-22 ENCOUNTER — Encounter: Payer: Self-pay | Admitting: Family Medicine

## 2017-10-22 VITALS — BP 130/82 | HR 59 | Temp 98.5°F | Ht 62.5 in | Wt 189.2 lb

## 2017-10-22 DIAGNOSIS — F411 Generalized anxiety disorder: Secondary | ICD-10-CM | POA: Diagnosis not present

## 2017-10-22 DIAGNOSIS — E7800 Pure hypercholesterolemia, unspecified: Secondary | ICD-10-CM | POA: Diagnosis not present

## 2017-10-22 DIAGNOSIS — Z23 Encounter for immunization: Secondary | ICD-10-CM

## 2017-10-22 DIAGNOSIS — Z0000 Encounter for general adult medical examination without abnormal findings: Secondary | ICD-10-CM

## 2017-10-22 DIAGNOSIS — Z85820 Personal history of malignant melanoma of skin: Secondary | ICD-10-CM

## 2017-10-22 DIAGNOSIS — I10 Essential (primary) hypertension: Secondary | ICD-10-CM

## 2017-10-22 MED ORDER — ESCITALOPRAM OXALATE 5 MG PO TABS: 5.0000 mg | ORAL_TABLET | Freq: Every day | ORAL | 3 refills | Status: DC

## 2017-10-22 NOTE — Progress Notes (Signed)
Subjective:    Patient ID: Beverly Quinn, female    DOB: [birth year], 62 y.o.   MRN: [id number]  HPI The patient is here for annual wellness exam and preventative care.    Hypertension:   Good control on losartan daily and  HCTZ  intermittantly   BP Readings from Last 3 Encounters:  10/22/17 130/82  09/21/17 110/70  04/22/17 136/84  Using medication without problems or lightheadedness: none Chest pain with exertion:none Edema:none Short of breath:none Average home BPs: Other issues:  Elevated Cholesterol:  Good control on lipitor 10 mg daily Lab Results  Component Value Date   CHOL 172 10/19/2017   HDL 65.10 10/19/2017   LDLCALC 85 10/19/2017   TRIG 111.0 10/19/2017   CHOLHDL 3 10/19/2017  Using medications without problems: Muscle aches:  Diet compliance: moderate Exercise: 3-4 days a week, has started a spin class. Other complaints:   More situation anxiety than GAD:She has noted worsening of mood symptoms in last 2 weeks. She describes increased irritability. Has work situation that is not improving.  She feels good at home.. Just issues at work.. Feels overwhelmed. Plans to retire in 1 year.  Using clonazepam rarely.. Does not want to take at work.  Did well on  Lexapro 10 mg GAD7 5 PHQ9 7  melanoma lower left leg 2001, left shoulder 2018 Followed by Derm Dr. Ubaldo Glassing. No new concerning lesions today.  Social History /Family History/Past Medical History reviewed in detail and updated in EMR if needed.  Blood pressure 130/82, pulse (!) 59, temperature 98.5 F (36.9 C), temperature source Oral, height 5' 2.5" (1.588 m), weight 189 lb 4 oz (85.8 kg), last menstrual period 04/13/2004.   Review of Systems  Constitutional: Negative for fatigue and fever.  HENT: Negative for congestion.   Eyes: Negative for pain.  Respiratory: Negative for cough and shortness of breath.   Cardiovascular: Negative for chest pain, palpitations and leg swelling.  Gastrointestinal:  Negative for abdominal pain.  Genitourinary: Negative for dysuria and vaginal bleeding.  Musculoskeletal: Negative for back pain.  Neurological: Negative for syncope, light-headedness and headaches.  Psychiatric/Behavioral: Negative for dysphoric mood.       Objective:   Physical Exam  Constitutional: Vital signs are normal. She appears well-developed and well-nourished. She is cooperative.  Non-toxic appearance. She does not appear ill. No distress.  HENT:  Head: Normocephalic.  Right Ear: Hearing, tympanic membrane, external ear and ear canal normal.  Left Ear: Hearing, tympanic membrane, external ear and ear canal normal.  Nose: Nose normal.  Eyes: Pupils are equal, round, and reactive to light. Conjunctivae, EOM and lids are normal. Lids are everted and swept, no foreign bodies found.  Neck: Trachea normal and normal range of motion. Neck supple. Carotid bruit is not present. No thyroid mass and no thyromegaly present.  Cardiovascular: Normal rate, regular rhythm, S1 normal, S2 normal, normal heart sounds and intact distal pulses. Exam reveals no gallop.  No murmur heard. Pulmonary/Chest: Effort normal and breath sounds normal. No respiratory distress. She has no wheezes. She has no rhonchi. She has no rales. No breast tenderness, discharge or bleeding.  Abdominal: Soft. Normal appearance and bowel sounds are normal. She exhibits no distension, no fluid wave, no abdominal bruit and no mass. There is no hepatosplenomegaly. There is no tenderness. There is no rebound, no guarding and no CVA tenderness. No hernia.  Genitourinary: Vagina normal and uterus normal. No breast tenderness, discharge or bleeding. Pelvic exam was performed with patient supine. There  is no rash, tenderness or lesion on the right labia. There is no rash, tenderness or lesion on the left labia. Uterus is not enlarged and not tender. Cervix exhibits no motion tenderness, no discharge and no friability. Right adnexum  displays no mass, no tenderness and no fullness. Left adnexum displays no mass, no tenderness and no fullness.  Lymphadenopathy:    She has no cervical adenopathy.    She has no axillary adenopathy.  Neurological: She is alert. She has normal strength. No cranial nerve deficit or sensory deficit.  Skin: Skin is warm, dry and intact. No rash noted.  Small noninfected sebaceous cyst on right upper back not near past bx site.  Psychiatric: Her speech is normal and behavior is normal. Judgment normal. Her mood appears not anxious. Cognition and memory are normal. She does not exhibit a depressed mood.          Assessment & Plan:  The patient's preventative maintenance and recommended screening tests for an annual wellness exam were reviewed in full today. Brought up to date unless services declined.  Counselled on the importance of diet, exercise, and its role in overall health and mortality. The patient's FH and SH was reviewed, including their home life, tobacco status, and drug and alcohol status.   Vaccines: due for tdap, had shingle in 2015 Pap/DVE:  Per GYN, 09/2017 Mammo: per GYN 02/2017, yearly Bone Density: 2016 normal.repeat in 5 years. Colon:  Mother colon cancer repeat q 5 years , Buccini.. Has appt scheduled Smoking Status:none ETOH/ drug use: rare/ none  Hep C: neg  HIV screen: neg

## 2017-10-22 NOTE — Assessment & Plan Note (Signed)
Well controlled. Continue current medication. Encouraged exercise, weight loss, healthy eating habits.  

## 2017-10-22 NOTE — Addendum Note (Signed)
Addended by: Carter Kitten on: 10/22/2017 09:22 AM   Modules accepted: Orders

## 2017-10-22 NOTE — Assessment & Plan Note (Signed)
More situational anxiety but pt wishes to start med to treat. Start lexapro.  Follow up in 4 weeks.

## 2017-10-22 NOTE — Assessment & Plan Note (Signed)
Well controlled. Continue current medication.  

## 2017-10-22 NOTE — Assessment & Plan Note (Signed)
melanoma lower left leg 2001, left shoulder 2018 Followed by Derm Dr. Ubaldo Glassing. No new concerning lesions today.

## 2017-11-01 ENCOUNTER — Encounter: Payer: Self-pay | Admitting: Family Medicine

## 2017-11-01 ENCOUNTER — Ambulatory Visit: Payer: Commercial Managed Care - PPO | Admitting: Family Medicine

## 2017-11-01 VITALS — BP 140/88 | HR 66 | Temp 98.6°F | Ht 62.5 in | Wt 187.5 lb

## 2017-11-01 DIAGNOSIS — R002 Palpitations: Secondary | ICD-10-CM

## 2017-11-01 DIAGNOSIS — F411 Generalized anxiety disorder: Secondary | ICD-10-CM

## 2017-11-01 DIAGNOSIS — R112 Nausea with vomiting, unspecified: Secondary | ICD-10-CM | POA: Diagnosis not present

## 2017-11-01 DIAGNOSIS — G44209 Tension-type headache, unspecified, not intractable: Secondary | ICD-10-CM

## 2017-11-01 DIAGNOSIS — R197 Diarrhea, unspecified: Secondary | ICD-10-CM

## 2017-11-01 NOTE — Patient Instructions (Signed)
Cut your Escitalopram tablets in half and take half a pill a day for 1 week, then increase to 1 full pill daily

## 2017-11-01 NOTE — Progress Notes (Signed)
Dr. Frederico Hamman T. Slater Mcmanaman, MD, [facility] [place], [address] Phone: [phone] Fax: [phone]  11/01/2017  Patient: Beverly Quinn, MRN: [id number], DOB: [birth year], 62 y.o.  Primary Physician:  Jinny Sanders, MD   Chief Complaint  Patient presents with  . Headache    nauseated, started a new medication about a week ago   Subjective:   Beverly Quinn is a 62 y.o. very pleasant female patient who presents with the following:  Recently started Lexapro at 5 mg - concerned this may be a SE from meds, but she has taken it in the past.   Saw Dr. Diona Browner - got a little bit of a headache and had a bad headache. Felt clammy and lightheaded. Took 2 advil and got neausea, had 2 BM. 2nd BM was more loose.   Temp normal.   No known exposure. Paralegal. Estrellita Ludwig.   She thought maybe having palpitations, rare increase in HR.  Past Medical History, Surgical History, Social History, Family History, Problem List, Medications, and Allergies have been reviewed and updated if relevant.  Patient Active Problem List   Diagnosis Date Noted  . Allergic rhinitis 04/22/2017  . GAD (generalized anxiety disorder) 04/22/2017  . Hypertension   . History of IBS   . Fibroid   . Hyperlipidemia   . History of melanoma   . Difficult intubation     Past Medical History:  Diagnosis Date  . Abnormal Pap smear of cervix   . Cancer ([place])    melanoma lower left leg, left arm  . Difficult intubation    took 3 tries with nerve block  . Fibroid    5 cm  . Hyperlipidemia   . Hypertension   . IBS (irritable bowel syndrome)   . Shingles     Past Surgical History:  Procedure Laterality Date  . CARPAL TUNNEL RELEASE  2004   left & right  . CERVICAL BIOPSY  W/ LOOP ELECTRODE EXCISION     CIN1  . COLPOSCOPY     VAIN1  . melanoma removal  05/2016   left arm  . ROTATOR CUFF REPAIR Right 2012  . TONSILLECTOMY    . TUBAL LIGATION       Social History   Socioeconomic History  . Marital status: Divorced    Spouse name: Not on file  . Number of children: Not on file  . Years of education: Not on file  . Highest education level: Not on file  Occupational History  . Not on file  Social Needs  . Financial resource strain: Not on file  . Food insecurity:    Worry: Not on file    Inability: Not on file  . Transportation needs:    Medical: Not on file    Non-medical: Not on file  Tobacco Use  . Smoking status: Never Smoker  . Smokeless tobacco: Never Used  Substance and Sexual Activity  . Alcohol use: Yes    Alcohol/week: 0.0 oz    Comment: 1 a month  . Drug use: No  . Sexual activity: Not Currently    Partners: Male    Birth control/protection: Surgical, Post-menopausal    Comment: BTL  Lifestyle  . Physical activity:    Days per week: Not on file    Minutes per session: Not on file  . Stress: Not on file  Relationships  . Social connections:    Talks on phone: Not  on file    Gets together: Not on file    Attends religious service: Not on file    Active member of club or organization: Not on file    Attends meetings of clubs or organizations: Not on file    Relationship status: Not on file  . Intimate partner violence:    Fear of current or ex partner: Not on file    Emotionally abused: Not on file    Physically abused: Not on file    Forced sexual activity: Not on file  Other Topics Concern  . Not on file  Social History Narrative  . Not on file    Family History  Problem Relation Age of Onset  . Cancer Mother        uterine & colon  . Hypertension Mother   . Cancer Sister        lung  . Hypertension Sister     Allergies  Allergen Reactions  . Doxycycline Other (See Comments)    dizziness  . Hydrocodone     Flu like symptoms    Medication list reviewed and updated in full in [facility].   GEN: No acute illnesses, no fevers, chills. GI: No n/v/d, eating normally Pulm:  No SOB Interactive and getting along well at home.  Otherwise, ROS is as per the HPI.  Objective:   BP 140/88   Pulse 66   Temp 98.6 F (37 C) (Oral)   Ht 5' 2.5" (1.588 m)   Wt 187 lb 8 oz (85 kg)   LMP 04/13/2004   BMI 33.75 kg/m   GEN: WDWN, NAD, Non-toxic, A & O x 3 HEENT: Atraumatic, Normocephalic. Neck supple. No masses, No LAD. Ears and Nose: No external deformity. CV: RRR, No M/G/R. No JVD. No thrill. No extra heart sounds. PULM: CTA B, no wheezes, crackles, rhonchi. No retractions. No resp. distress. No accessory muscle use. EXTR: No c/c/e NEURO Normal gait.  PSYCH: Normally interactive. Conversant. Mildly anxious  Laboratory and Imaging Data:  Assessment and Plan:   GAD (generalized anxiety disorder)  Palpitations - Plan: EKG 12-Lead  Nausea and vomiting, intractability of vomiting not specified, unspecified vomiting type  Diarrhea, unspecified type  Acute non intractable tension-type headache  EKG: Normal sinus rhythm. Normal axis, normal R wave progression, No acute ST elevation or depression.   Lexapro 5 mg, with ongoing n and v x 2, diarrhea x 2 this am. Felt lightheaded, woozy.   EKG reassuring.   Unclear if GI bug, SSRI SE or combination.  Nontoxic in appearance.   Cut lexapro dosing in 1/2 and supportive care.   Follow-up: No follow-ups on file.  Orders Placed This Encounter  Procedures  . EKG 12-Lead    Signed,  Nehemias Sauceda T. Makala Fetterolf, MD   Allergies as of 11/01/2017      Reactions   Doxycycline Other (See Comments)   dizziness   Hydrocodone    Flu like symptoms      Medication List        Accurate as of 11/01/17  9:44 AM. Always use your most recent med list.          ADVIL PO Take by mouth as needed.   atorvastatin 10 MG tablet Commonly known as:  LIPITOR Take 10 mg by mouth daily.   clonazePAM 0.5 MG tablet Commonly known as:  KLONOPIN Take 0.5 mg by mouth daily as needed for anxiety.   CO Q 10 PO Take 1 tablet  by  mouth. 4 times a week   escitalopram 5 MG tablet Commonly known as:  LEXAPRO Take 1 tablet (5 mg total) by mouth daily.   fluticasone 50 MCG/ACT nasal spray Commonly known as:  FLONASE Place 2 sprays into both nostrils daily as needed.   hydrochlorothiazide 12.5 MG capsule Commonly known as:  MICROZIDE Take 1 capsule (12.5 mg total) by mouth daily.   losartan 50 MG tablet Commonly known as:  COZAAR Take 1 tablet (50 mg total) by mouth daily.   montelukast 10 MG tablet Commonly known as:  SINGULAIR Take 10 mg by mouth daily as needed.   MULTIVITAMIN PO Take by mouth daily.   valACYclovir 1000 MG tablet Commonly known as:  VALTREX Take one for 3 days at onset of outbreak   VITAMIN B-12 PO Take 1 tablet by mouth daily.   VITAMIN D (ERGOCALCIFEROL) PO Take 1,000 Int'l Units by mouth daily.

## 2017-11-11 DIAGNOSIS — D2261 Melanocytic nevi of right upper limb, including shoulder: Secondary | ICD-10-CM | POA: Diagnosis not present

## 2017-11-11 DIAGNOSIS — L700 Acne vulgaris: Secondary | ICD-10-CM | POA: Diagnosis not present

## 2017-11-11 DIAGNOSIS — D2262 Melanocytic nevi of left upper limb, including shoulder: Secondary | ICD-10-CM | POA: Diagnosis not present

## 2017-11-11 DIAGNOSIS — L720 Epidermal cyst: Secondary | ICD-10-CM | POA: Diagnosis not present

## 2017-11-11 DIAGNOSIS — L723 Sebaceous cyst: Secondary | ICD-10-CM | POA: Diagnosis not present

## 2017-11-14 ENCOUNTER — Other Ambulatory Visit: Payer: Self-pay | Admitting: Family Medicine

## 2017-11-26 ENCOUNTER — Ambulatory Visit: Payer: Commercial Managed Care - PPO | Admitting: Family Medicine

## 2017-11-29 ENCOUNTER — Ambulatory Visit: Payer: Commercial Managed Care - PPO | Admitting: Internal Medicine

## 2017-11-29 ENCOUNTER — Encounter: Payer: Self-pay | Admitting: Internal Medicine

## 2017-11-29 VITALS — BP 128/80 | HR 64 | Temp 98.3°F | Wt 185.0 lb

## 2017-11-29 DIAGNOSIS — T148XXA Other injury of unspecified body region, initial encounter: Secondary | ICD-10-CM

## 2017-11-29 MED ORDER — CEPHALEXIN 500 MG PO CAPS: 500.0000 mg | ORAL_CAPSULE | Freq: Two times a day (BID) | ORAL | 0 refills | Status: DC

## 2017-11-29 NOTE — Progress Notes (Signed)
Subjective:    Patient ID: Beverly Quinn, female    DOB: 1955-06-20, 62 y.o.   MRN: [id number]  HPI  Pt presents to the clinic today with c/o a splinter under her left thumbnail. She reports this occurred yesterday. She soaked her finger in peroxide and used tweezers to try to get it out, now the area is very sore. She denies any drainage from the area. Tetanus UTD.  Review of Systems  Past Medical History:  Diagnosis Date  . Abnormal Pap smear of cervix   . Cancer ([place])    melanoma lower left leg, left arm  . Difficult intubation    took 3 tries with nerve block  . Fibroid    5 cm  . Hyperlipidemia   . Hypertension   . IBS (irritable bowel syndrome)   . Shingles     Current Outpatient Medications  Medication Sig Dispense Refill  . atorvastatin (LIPITOR) 10 MG tablet Take 10 mg by mouth daily.    . clonazePAM (KLONOPIN) 0.5 MG tablet Take 0.5 mg by mouth daily as needed for anxiety.    . Coenzyme Q10 (CO Q 10 PO) Take 1 tablet by mouth. 4 times a week    . Cyanocobalamin (VITAMIN B-12 PO) Take 1 tablet by mouth daily.     . fluticasone (FLONASE) 50 MCG/ACT nasal spray Place 2 sprays into both nostrils daily as needed.     . hydrochlorothiazide (MICROZIDE) 12.5 MG capsule Take 1 capsule (12.5 mg total) by mouth daily. (Patient taking differently: Take 12.5 mg by mouth every other day. ) 90 capsule 1  . Ibuprofen (ADVIL PO) Take by mouth as needed.    Marland Kitchen losartan (COZAAR) 50 MG tablet Take 1 tablet (50 mg total) by mouth daily. 90 tablet 1  . montelukast (SINGULAIR) 10 MG tablet Take 10 mg by mouth daily as needed.     . Multiple Vitamins-Minerals (MULTIVITAMIN PO) Take by mouth daily.    . valACYclovir (VALTREX) 1000 MG tablet Take one for 3 days at onset of outbreak 30 tablet 12  . VITAMIN D, ERGOCALCIFEROL, PO Take 1,000 Int'l Units by mouth daily.      No current facility-administered medications for this visit.     Allergies  Allergen Reactions  . Doxycycline Other  (See Comments)    dizziness  . Hydrocodone     Flu like symptoms    Family History  Problem Relation Age of Onset  . Cancer Mother        uterine & colon  . Hypertension Mother   . Cancer Sister        lung  . Hypertension Sister     Social History   Socioeconomic History  . Marital status: Divorced    Spouse name: Not on file  . Number of children: Not on file  . Years of education: Not on file  . Highest education level: Not on file  Occupational History  . Not on file  Social Needs  . Financial resource strain: Not on file  . Food insecurity:    Worry: Not on file    Inability: Not on file  . Transportation needs:    Medical: Not on file    Non-medical: Not on file  Tobacco Use  . Smoking status: Never Smoker  . Smokeless tobacco: Never Used  Substance and Sexual Activity  . Alcohol use: Yes    Alcohol/week: 0.0 standard drinks    Comment: 1 a month  . Drug  use: No  . Sexual activity: Not Currently    Partners: Male    Birth control/protection: Surgical, Post-menopausal    Comment: BTL  Lifestyle  . Physical activity:    Days per week: Not on file    Minutes per session: Not on file  . Stress: Not on file  Relationships  . Social connections:    Talks on phone: Not on file    Gets together: Not on file    Attends religious service: Not on file    Active member of club or organization: Not on file    Attends meetings of clubs or organizations: Not on file    Relationship status: Not on file  . Intimate partner violence:    Fear of current or ex partner: Not on file    Emotionally abused: Not on file    Physically abused: Not on file    Forced sexual activity: Not on file  Other Topics Concern  . Not on file  Social History Narrative  . Not on file     Constitutional: Denies fever, malaise, fatigue, headache or abrupt weight changes.   Skin: Pt reports splinter under left thumbnail. Denies rashes, lesions or ulcercations.    No other  specific complaints in a complete review of systems (except as listed in HPI above).      Objective:   Physical Exam   BP 128/80   Pulse 64   Temp 98.3 F (36.8 C) (Oral)   Wt 185 lb (83.9 kg)   LMP 04/13/2004   SpO2 98%   BMI 33.30 kg/m  Wt Readings from Last 3 Encounters:  11/29/17 185 lb (83.9 kg)  11/01/17 187 lb 8 oz (85 kg)  10/22/17 189 lb 4 oz (85.8 kg)    General: Appears her stated age, well developed, well nourished in NAD. Skin: Splinter noted under left lateral thumbnail, extending to mid nail. No redness, warmth or pus noted.   BMET    Component Value Date/Time   NA 140 10/19/2017 0853   K 4.2 10/19/2017 0853   CL 102 10/19/2017 0853   CO2 32 10/19/2017 0853   GLUCOSE 101 (H) 10/19/2017 0853   BUN 12 10/19/2017 0853   CREATININE 0.81 10/19/2017 0853   CALCIUM 9.6 10/19/2017 0853    Lipid Panel     Component Value Date/Time   CHOL 172 10/19/2017 0853   TRIG 111.0 10/19/2017 0853   HDL 65.10 10/19/2017 0853   CHOLHDL 3 10/19/2017 0853   VLDL 22.2 10/19/2017 0853   LDLCALC 85 10/19/2017 0853    CBC    Component Value Date/Time   WBC 5.4 02/19/2014 1455   RBC 5.43 (H) 02/19/2014 1455   HGB 15.9 (H) 02/19/2014 1455   HCT 46.5 (H) 02/19/2014 1455   PLT 246 02/19/2014 1455   MCV 85.6 02/19/2014 1455   MCH 29.3 02/19/2014 1455   MCHC 34.2 02/19/2014 1455   RDW 13.1 02/19/2014 1455    Hgb A1C No results found for: HGBA1C         Assessment & Plan:   Splinter, Left Thumbnail:  Tried to remove by cutting nail, using needle Offered digital block and to remove lateral edge of nail, she declined Soak daily in [employer] for Keflex 500 mg BID to take if you notice s/s of infection  Return precautions discussed Webb Silversmith, NP

## 2017-11-29 NOTE — Patient Instructions (Signed)
Sliver Removal, Care After A sliver-also called a splinter-is a small and thin broken piece of an object that gets stuck (embedded) under the skin. A sliver can create a deep wound that can easily become infected. It is important to care for the wound after a sliver is removed to help prevent infection and other problems from developing. What can I expect after the procedure? Slivers often break into smaller pieces when they are removed. If pieces of your sliver broke off and stayed in your skin, you will eventually see them working themselves out and you may feel some pain at the wound site. This is normal. Follow these instructions at home:  Keep all follow-up visits as directed by your health care provider. This is important.  There are many different ways to close and cover a wound, including stitches (sutures) and adhesive strips. Follow your health care provider's instructions about: ? Wound care. ? Bandage (dressing) changes and removal. ? Wound closure removal.  Check the wound site every day for signs of infection. Watch for: ? Red streaks coming from the wound. ? Fever. ? Redness or tenderness around the wound. ? Fluid, blood, or pus coming from the wound. ? A bad smell coming from the wound. Contact a health care provider if:  You think that a piece of the sliver is still in your skin.  Your wound was closed, as with sutures, and the edges of the wound break open.  You have signs of infection, including: ? New or worsening redness around the wound. ? New or worsening tenderness around the wound. ? Fluid, blood, or pus coming from the wound. ? A bad smell coming from the wound or dressing. Get help right away if: You have any of the following signs of infection:  Red streaks coming from the wound.  An unexplained fever.  This information is not intended to replace advice given to you by your health care provider. Make sure you discuss any questions you have with your  health care provider. Document Released: 03/27/2000 Document Revised: 11/24/2015 Document Reviewed: 11/30/2013 Elsevier Interactive Patient Education  [employer].

## 2017-12-17 ENCOUNTER — Ambulatory Visit: Payer: Commercial Managed Care - PPO | Admitting: Family Medicine

## 2017-12-18 ENCOUNTER — Other Ambulatory Visit: Payer: Self-pay | Admitting: Family Medicine

## 2018-01-07 DIAGNOSIS — D123 Benign neoplasm of transverse colon: Secondary | ICD-10-CM | POA: Diagnosis not present

## 2018-01-07 DIAGNOSIS — Z86010 Personal history of colonic polyps: Secondary | ICD-10-CM | POA: Diagnosis not present

## 2018-01-07 LAB — HM COLONOSCOPY

## 2018-01-24 ENCOUNTER — Encounter: Payer: Self-pay | Admitting: Family Medicine

## 2018-01-26 ENCOUNTER — Other Ambulatory Visit: Payer: Self-pay | Admitting: Certified Nurse Midwife

## 2018-01-26 DIAGNOSIS — Z1231 Encounter for screening mammogram for malignant neoplasm of breast: Secondary | ICD-10-CM

## 2018-01-31 DIAGNOSIS — H02052 Trichiasis without entropian right lower eyelid: Secondary | ICD-10-CM | POA: Diagnosis not present

## 2018-01-31 DIAGNOSIS — H2513 Age-related nuclear cataract, bilateral: Secondary | ICD-10-CM | POA: Diagnosis not present

## 2018-01-31 DIAGNOSIS — H11002 Unspecified pterygium of left eye: Secondary | ICD-10-CM | POA: Diagnosis not present

## 2018-01-31 DIAGNOSIS — H524 Presbyopia: Secondary | ICD-10-CM | POA: Diagnosis not present

## 2018-02-05 IMAGING — MG 2D DIGITAL SCREENING BILATERAL MAMMOGRAM WITH CAD AND ADJUNCT TO
8 of 12 series · 8 of 28 positions shown · non-contrast
Comparison: Previous exam(s).

CLINICAL DATA: Screening.

EXAM:
2D DIGITAL SCREENING BILATERAL MAMMOGRAM WITH CAD AND ADJUNCT TOMO

[R CC synth-2D]
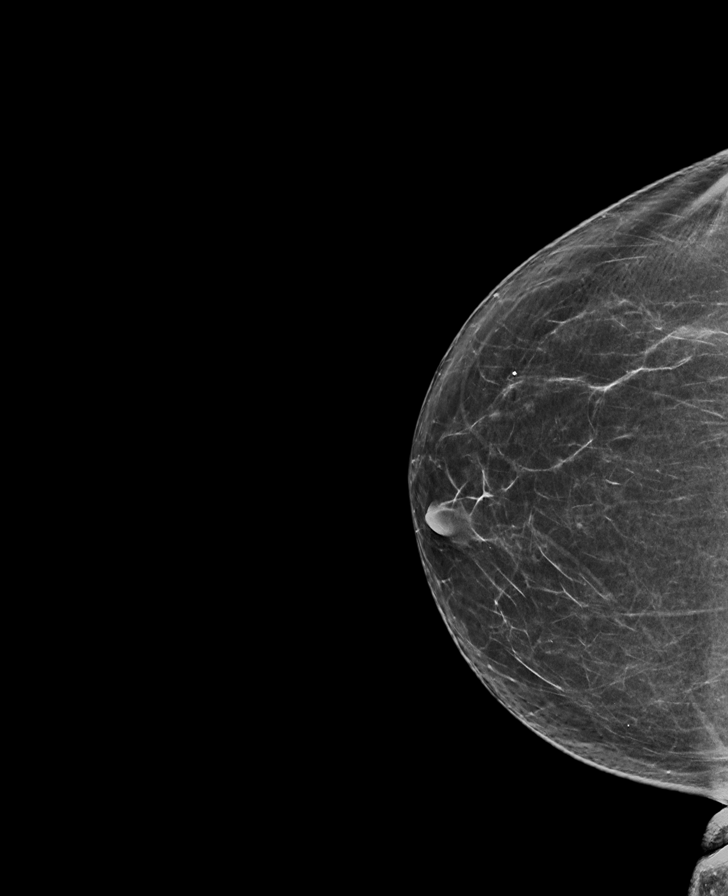

[R MLO]
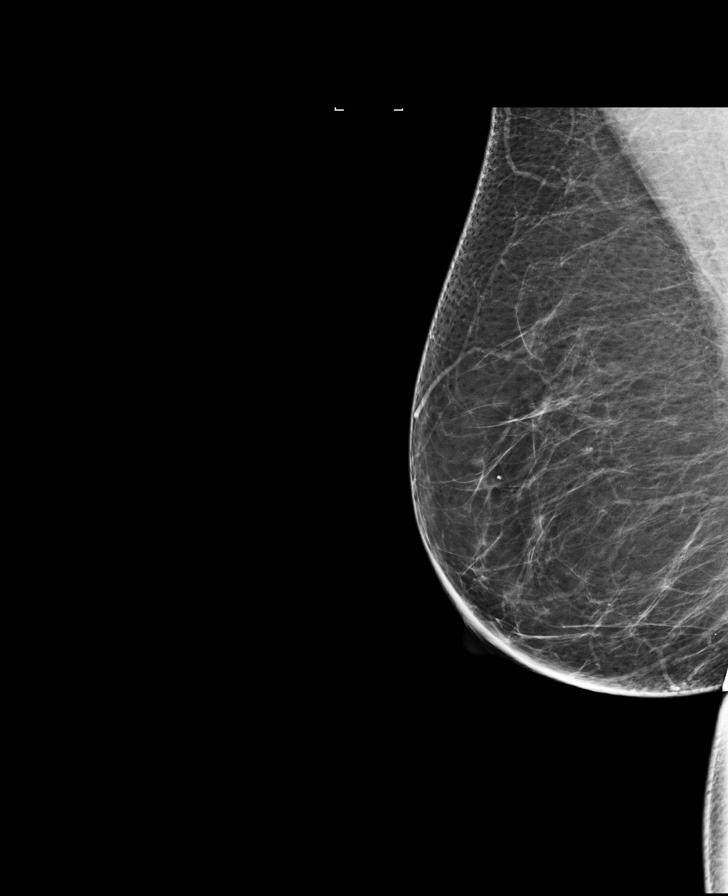

[L CC]
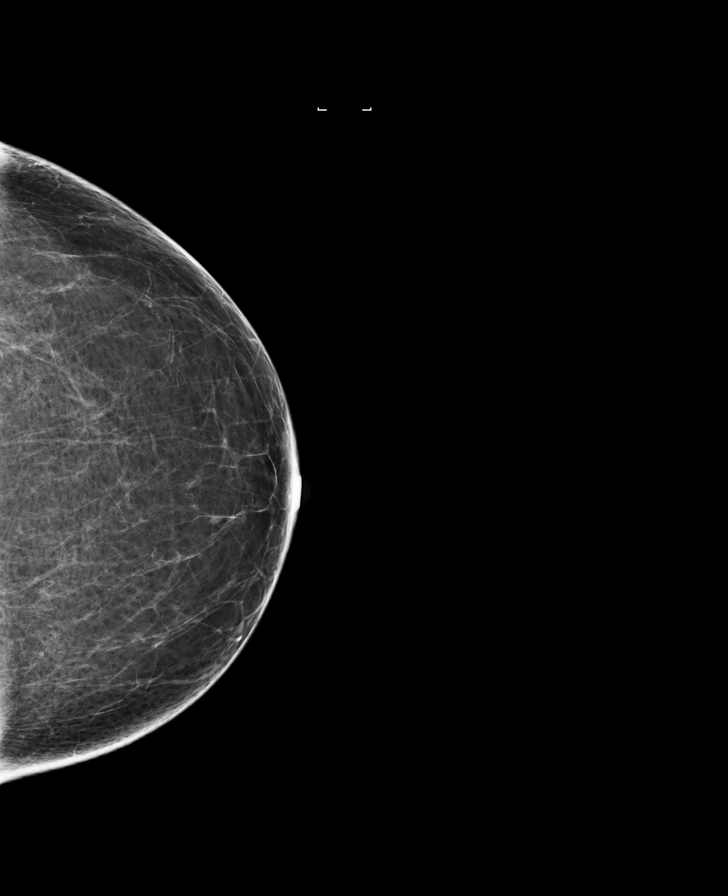

[L CC synth-2D]
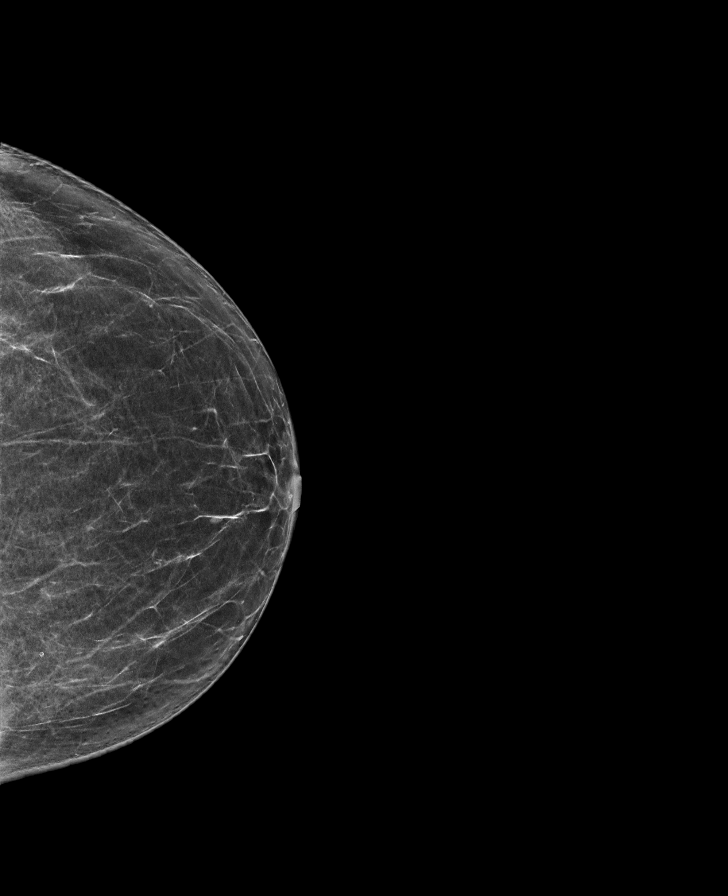

[R MLO synth-2D]
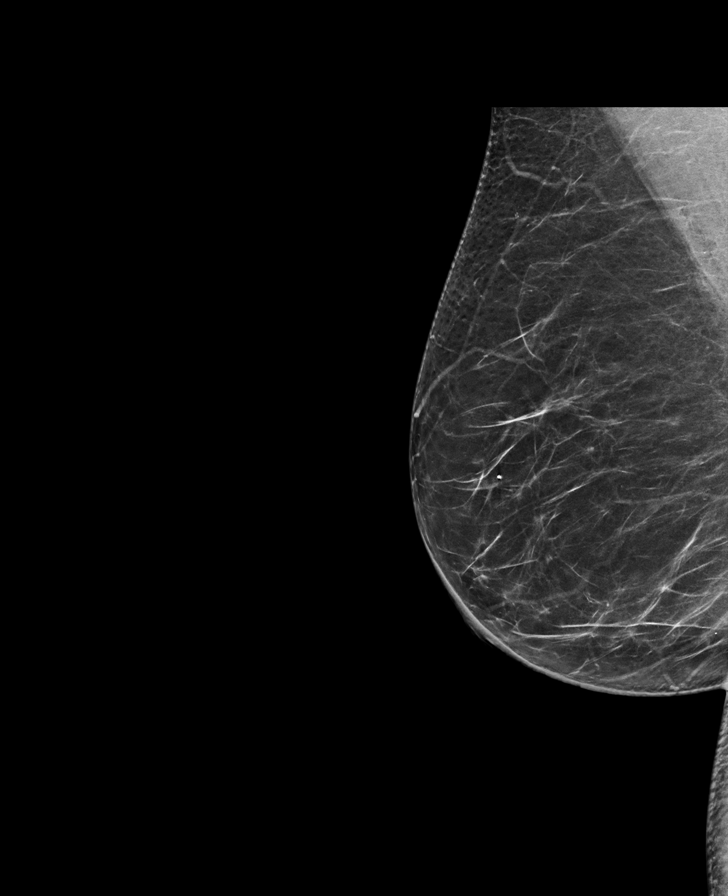

[R CC]
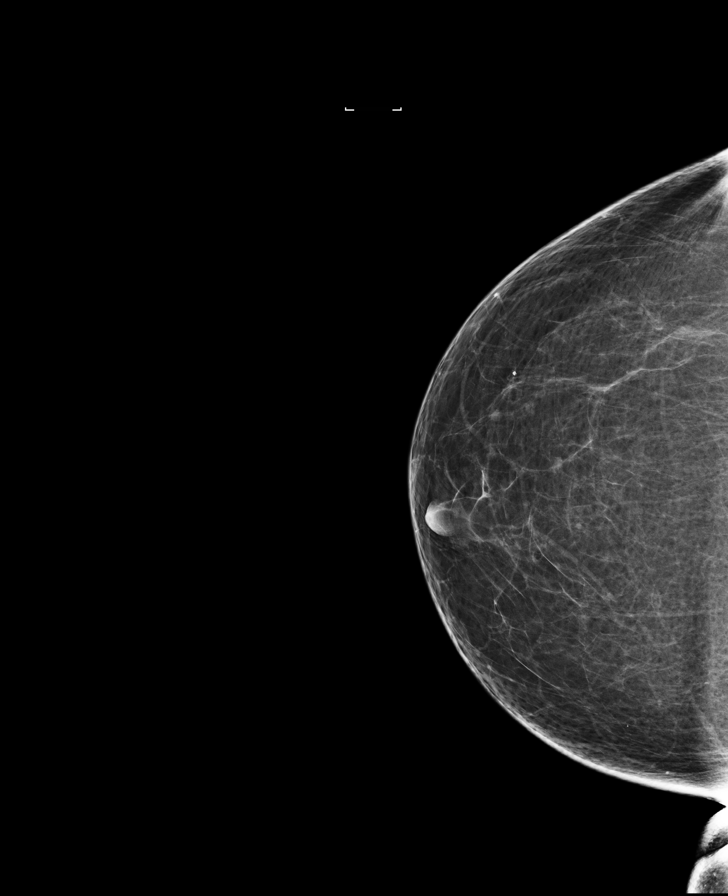

[L MLO]
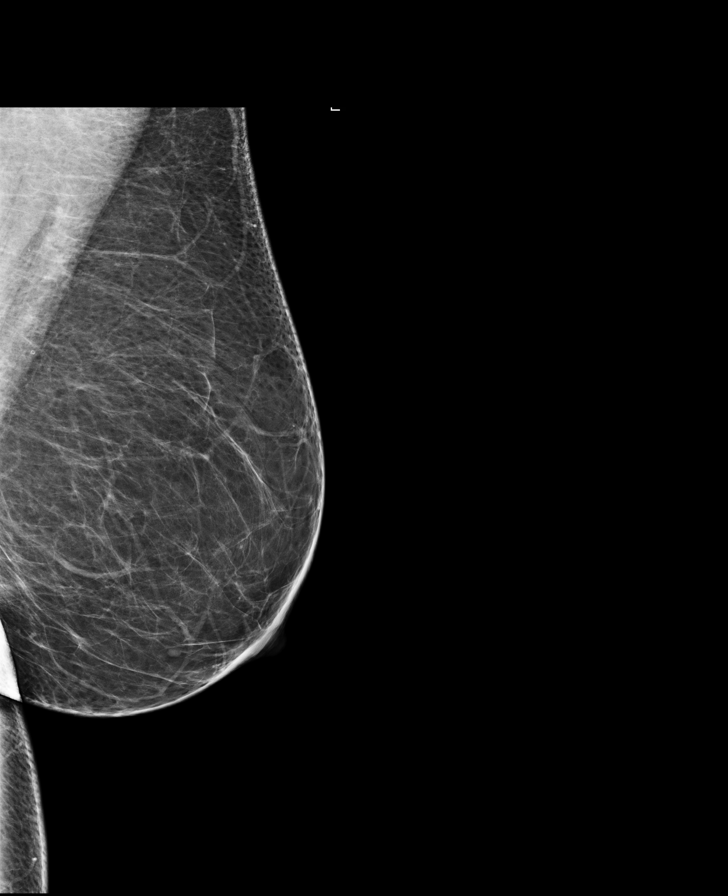

[L MLO synth-2D]
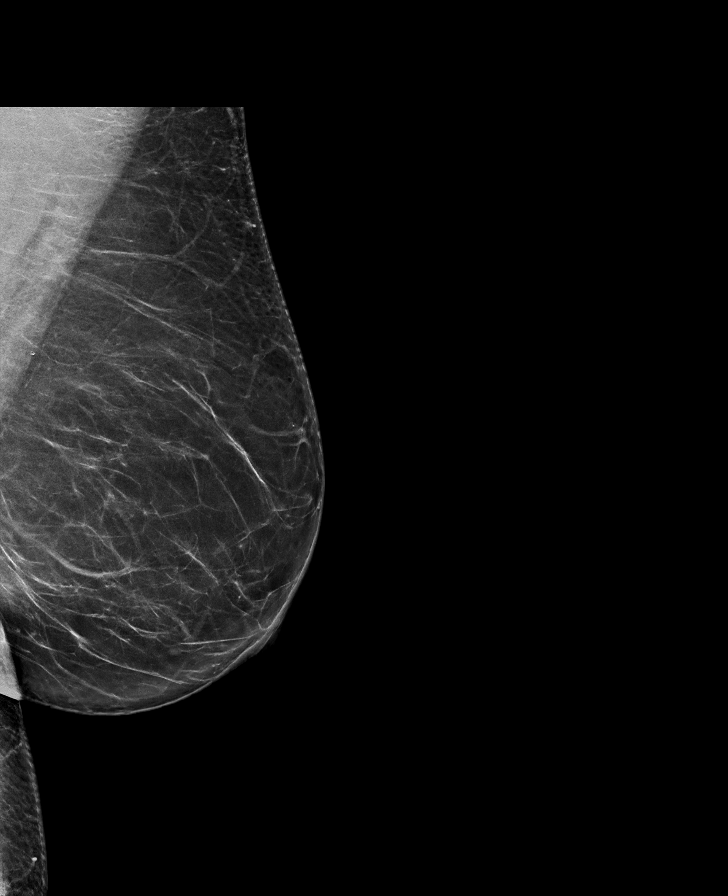

[8 of 28 positions shown; findings below may reference images not displayed]

ACR Breast Density Category b: There are scattered areas of
fibroglandular density.
FINDINGS: There are no findings suspicious for malignancy. Images were
processed with CAD.
IMPRESSION: No mammographic evidence of malignancy. A result letter of this
screening mammogram will be mailed directly to the patient.

RECOMMENDATION:
Screening mammogram in one year. (Code:97-6-RS4)

BI-RADS CATEGORY  1: Negative.

## 2018-02-14 ENCOUNTER — Telehealth: Payer: Self-pay | Admitting: *Deleted

## 2018-02-14 MED ORDER — LOSARTAN POTASSIUM 100 MG PO TABS: 50.0000 mg | ORAL_TABLET | Freq: Every day | ORAL | 1 refills | Status: DC

## 2018-02-14 NOTE — Telephone Encounter (Signed)
[facility] 100 mg to take 1/2 tablet daily as instructed by Dr. Diona Browner.

## 2018-02-14 NOTE — Telephone Encounter (Signed)
Pharmacist [facility]) called from [facility] stating that Losartan 50 mg is on back order and wants to know if script can be changed to Losartan 100 mg and patient can break the pill in half/ Please advise.

## 2018-02-14 NOTE — Telephone Encounter (Signed)
Okay to make the change as requested.

## 2018-02-15 ENCOUNTER — Telehealth: Payer: Self-pay | Admitting: Family Medicine

## 2018-02-15 MED ORDER — ATORVASTATIN CALCIUM 10 MG PO TABS: 10.0000 mg | ORAL_TABLET | Freq: Every day | ORAL | 2 refills | Status: DC

## 2018-02-15 NOTE — Telephone Encounter (Signed)
Best number [phone] PT called checking on her atorvastatin pharmacy stating they have sent refill request Pt is out of her meds [facility] [place]

## 2018-02-15 NOTE — Telephone Encounter (Signed)
[facility] [place]  Ms. Wahlberg notified by telephone.

## 2018-03-03 ENCOUNTER — Ambulatory Visit
Admission: RE | Admit: 2018-03-03 | Discharge: 2018-03-03 | Disposition: A | Discharge status: Home / Self Care | Payer: Commercial Managed Care - PPO | Source: Ambulatory Visit | Attending: Certified Nurse Midwife | Admitting: Certified Nurse Midwife

## 2018-03-03 DIAGNOSIS — Z1231 Encounter for screening mammogram for malignant neoplasm of breast: Secondary | ICD-10-CM | POA: Diagnosis not present

## 2018-03-08 ENCOUNTER — Other Ambulatory Visit: Payer: Self-pay | Admitting: *Deleted

## 2018-03-08 DIAGNOSIS — B009 Herpesviral infection, unspecified: Secondary | ICD-10-CM

## 2018-03-08 MED ORDER — ATORVASTATIN CALCIUM 10 MG PO TABS: 10.0000 mg | ORAL_TABLET | Freq: Every day | ORAL | 1 refills | Status: DC

## 2018-03-08 MED ORDER — VALACYCLOVIR HCL 1 G PO TABS: ORAL_TABLET | ORAL | 1 refills | Status: AC

## 2018-03-08 MED ORDER — HYDROCHLOROTHIAZIDE 12.5 MG PO CAPS: 12.5000 mg | ORAL_CAPSULE | ORAL | 1 refills | Status: DC

## 2018-03-08 MED ORDER — MONTELUKAST SODIUM 10 MG PO TABS: 10.0000 mg | ORAL_TABLET | Freq: Every day | ORAL | 1 refills | Status: DC | PRN

## 2018-03-08 MED ORDER — LOSARTAN POTASSIUM 100 MG PO TABS: 50.0000 mg | ORAL_TABLET | Freq: Every day | ORAL | 1 refills | Status: DC

## 2018-03-08 NOTE — Telephone Encounter (Signed)
Last office visit 11/29/2017 with R. Baity for Splinter.  Last refilled 09/21/2017 for #30 with 12 refills by Melvia Heaps, CNM.  Patient is now using mail order pharmacy.  Ok to refill?

## 2018-03-14 ENCOUNTER — Telehealth: Payer: Self-pay | Admitting: Family Medicine

## 2018-03-14 MED ORDER — HYDROCHLOROTHIAZIDE 12.5 MG PO CAPS: 12.5000 mg | ORAL_CAPSULE | ORAL | 1 refills | Status: DC

## 2018-03-14 NOTE — Telephone Encounter (Signed)
Patient called last week to get 5 prescriptions refilled.  They were all refilled except for the HCTZ.  Patient would like to get it refilled and sent to [facility]. The request was sent last week, but [facility] didn't get a response.

## 2018-03-14 NOTE — Telephone Encounter (Signed)
[facility] at [phone] option 2

## 2018-03-14 NOTE — Telephone Encounter (Signed)
Refill was resent.  For some reason the one that was supposedly sent last week was set on Phone In so it did not go electronically.

## 2018-04-11 ENCOUNTER — Other Ambulatory Visit: Payer: Self-pay | Admitting: Family Medicine

## 2018-04-11 NOTE — Telephone Encounter (Signed)
Last office visit 11/29/2017 for Splinter with R. Baity.  Not on current medication list.  No future appointments.  Ok to refill?

## 2018-09-23 ENCOUNTER — Other Ambulatory Visit: Payer: Self-pay | Admitting: *Deleted

## 2018-09-23 MED ORDER — LOSARTAN POTASSIUM 100 MG PO TABS: 50.0000 mg | ORAL_TABLET | Freq: Every day | ORAL | 0 refills | Status: DC

## 2018-09-23 MED ORDER — HYDROCHLOROTHIAZIDE 12.5 MG PO CAPS: 12.5000 mg | ORAL_CAPSULE | ORAL | 0 refills | Status: DC

## 2018-09-23 MED ORDER — ATORVASTATIN CALCIUM 10 MG PO TABS: 10.0000 mg | ORAL_TABLET | Freq: Every day | ORAL | 0 refills | Status: DC

## 2018-09-23 MED ORDER — MONTELUKAST SODIUM 10 MG PO TABS: 10.0000 mg | ORAL_TABLET | Freq: Every day | ORAL | 0 refills | Status: DC | PRN

## 2018-09-27 NOTE — Progress Notes (Signed)
63 y.o. [id number] Divorced  Caucasian Fe here for annual exam. Post menopausal no symptoms. Continues to have vaginal dryness and good results with coconut oil use. Sees Amy Bedsoe as PCP with labs and aex and cholesterol, hypertension,and singular management. No weight change,misses exercising. No other health changes.  Patient's last menstrual period was 04/13/2004.          Sexually active: No.  The current method of family planning is tubal ligation.    Exercising: No.  exercise Smoker:  no  Review of Systems  Constitutional: Negative.   HENT: Negative.   Eyes: Negative.   Respiratory: Negative.   Cardiovascular: Negative.   Gastrointestinal: Negative.   Genitourinary: Negative.   Musculoskeletal: Negative.   Skin: Negative.   Neurological: Negative.   Endo/Heme/Allergies: Negative.   Psychiatric/Behavioral: Negative.     Health Maintenance: Pap: 09-21-17 neg HPV HR neg History of Abnormal Pap: yes LEEP MMG:  03-03-18 category b density birads 1:neg Self Breast exams: no Colonoscopy:  2019 f/u 61yrs BMD:   2016 normal 5 years TDaP:  2019 Shingles: 2015, had swelling in arm Pneumonia: not done Hep C and HIV: both neg 2017 Labs: if  needed   reports that she has never smoked. She has never used smokeless tobacco. She reports current alcohol use. She reports that she does not use drugs.  Past Medical History:  Diagnosis Date  . Abnormal Pap smear of cervix   . Cancer ([place])    melanoma lower left leg, left arm  . Difficult intubation    took 3 tries with nerve block  . Fibroid    5 cm  . Hyperlipidemia   . Hypertension   . IBS (irritable bowel syndrome)   . Shingles     Past Surgical History:  Procedure Laterality Date  . CARPAL TUNNEL RELEASE  2004   left & right  . CERVICAL BIOPSY  W/ LOOP ELECTRODE EXCISION     CIN1  . COLPOSCOPY     VAIN1  . melanoma removal  05/2016   left arm  . ROTATOR CUFF REPAIR Right 2012  . TONSILLECTOMY    . TUBAL LIGATION       Current Outpatient Medications  Medication Sig Dispense Refill  . atorvastatin (LIPITOR) 10 MG tablet Take 1 tablet (10 mg total) by mouth daily. 90 tablet 0  . cephALEXin (KEFLEX) 500 MG capsule Take 1 capsule (500 mg total) by mouth 2 (two) times daily. 14 capsule 0  . clonazePAM (KLONOPIN) 0.5 MG tablet Take 0.5 mg by mouth daily as needed for anxiety.    . Coenzyme Q10 (CO Q 10 PO) Take 1 tablet by mouth. 4 times a week    . Cyanocobalamin (VITAMIN B-12 PO) Take 1 tablet by mouth daily.     . fluticasone (FLONASE) 50 MCG/ACT nasal spray Place 2 sprays into both nostrils daily as needed.     . hydrochlorothiazide (MICROZIDE) 12.5 MG capsule Take 1 capsule (12.5 mg total) by mouth every other day. 45 capsule 0  . Ibuprofen (ADVIL PO) Take by mouth as needed.    Marland Kitchen losartan (COZAAR) 100 MG tablet Take 0.5 tablets (50 mg total) by mouth daily. 45 tablet 0  . montelukast (SINGULAIR) 10 MG tablet Take 1 tablet (10 mg total) by mouth daily as needed. 90 tablet 0  . Multiple Vitamins-Minerals (MULTIVITAMIN PO) Take by mouth daily.    . valACYclovir (VALTREX) 1000 MG tablet Take one for 3 days at onset of outbreak 90 tablet  1  . VITAMIN D, ERGOCALCIFEROL, PO Take 1,000 Int'l Units by mouth daily.      No current facility-administered medications for this visit.     Family History  Problem Relation Age of Onset  . Cancer Mother        uterine & colon  . Hypertension Mother   . Cancer Sister        lung  . Hypertension Sister     ROS:  Pertinent items are noted in HPI.  Otherwise, a comprehensive ROS was negative.  Exam:   LMP 04/13/2004    Ht Readings from Last 3 Encounters:  11/01/17 5' 2.5" (1.588 m)  10/22/17 5' 2.5" (1.588 m)  09/21/17 5' 2.75" (1.594 m)    General appearance: alert, cooperative and appears stated age Head: Normocephalic, without obvious abnormality, atraumatic Neck: no adenopathy, supple, symmetrical, trachea midline and thyroid normal to inspection and  palpation Lungs: clear to auscultation bilaterally Breasts: normal appearance, no masses or tenderness, No nipple retraction or dimpling, No nipple discharge or bleeding, No axillary or supraclavicular adenopathy Heart: regular rate and rhythm Abdomen: soft, non-tender; no masses,  no organomegaly Extremities: extremities normal, atraumatic, no cyanosis or edema Skin: Skin color, texture, turgor normal. No rashes or lesions Lymph nodes: Cervical, supraclavicular, and axillary nodes normal. No abnormal inguinal nodes palpated Neurologic: Grossly normal   Pelvic: External genitalia:  no lesions              Urethra:  normal appearing urethra with no masses, tenderness or lesions              Bartholin's and Skene's: normal                 Vagina: normal appearing vagina with normal color and discharge, no lesions              Cervix: no cervical motion tenderness, no lesions and nulliparous appearance              Pap taken: Yes.   Bimanual Exam:  Uterus:  normal size, contour, position, consistency, mobility, non-tender and anteverted              Adnexa: normal adnexa and no mass, fullness, tenderness               Rectovaginal: Confirms               Anus:  normal sphincter tone, no lesions  Chaperone present: yes  A:  Well Woman with normal exam  Post menopausal no HRT  Vaginal dryness using coconut oil with good results  History of CIN 1 VAIN 1  History of HSV has Valtrex with refills(PCP  Hypertension, allergies with PCP management, all stable  P:   Reviewed health and wellness pertinent to exam  Discussed importance of notifying if vaginal bleeding.  Will advise if vaginal dryness increases  Continue follow up with PCP as indicated  Pap smear: yes   counseled on breast self exam, mammography screening, feminine hygiene, menopause, osteoporosis, adequate intake of calcium and vitamin D, diet and exercise  return annually or prn  An After Visit Summary was printed and  given to the patient.

## 2018-09-28 ENCOUNTER — Encounter: Payer: Self-pay | Admitting: Certified Nurse Midwife

## 2018-09-28 ENCOUNTER — Other Ambulatory Visit (HOSPITAL_COMMUNITY)
Admission: RE | Admit: 2018-09-28 | Discharge: 2018-09-28 | Disposition: A | Discharge status: Home / Self Care | Payer: PPO | Source: Ambulatory Visit | Attending: Certified Nurse Midwife | Admitting: Certified Nurse Midwife

## 2018-09-28 ENCOUNTER — Ambulatory Visit (INDEPENDENT_AMBULATORY_CARE_PROVIDER_SITE_OTHER): Payer: PPO | Admitting: Certified Nurse Midwife

## 2018-09-28 ENCOUNTER — Other Ambulatory Visit: Payer: Self-pay

## 2018-09-28 VITALS — BP 120/82 | HR 70 | Temp 98.1°F | Resp 16 | Ht 62.75 in | Wt 189.0 lb

## 2018-09-28 DIAGNOSIS — N951 Menopausal and female climacteric states: Secondary | ICD-10-CM

## 2018-09-28 DIAGNOSIS — Z780 Asymptomatic menopausal state: Secondary | ICD-10-CM | POA: Diagnosis not present

## 2018-09-28 DIAGNOSIS — Z01419 Encounter for gynecological examination (general) (routine) without abnormal findings: Secondary | ICD-10-CM

## 2018-09-28 DIAGNOSIS — Z8742 Personal history of other diseases of the female genital tract: Secondary | ICD-10-CM

## 2018-09-28 DIAGNOSIS — Z124 Encounter for screening for malignant neoplasm of cervix: Secondary | ICD-10-CM | POA: Insufficient documentation

## 2018-10-03 LAB — CYTOLOGY - PAP: Diagnosis: NEGATIVE

## 2018-10-17 ENCOUNTER — Telehealth: Payer: Self-pay | Admitting: Family Medicine

## 2018-10-17 DIAGNOSIS — E7800 Pure hypercholesterolemia, unspecified: Secondary | ICD-10-CM

## 2018-10-17 NOTE — Telephone Encounter (Signed)
-----   Message from Ellamae Sia sent at 10/17/2018 11:55 AM EDT ----- Regarding: lab orders for Wednesday, 7.8.20 Patient is scheduled for CPX labs, please order future labs, Thanks , Karna Christmas

## 2018-10-19 ENCOUNTER — Other Ambulatory Visit: Payer: Self-pay

## 2018-10-19 ENCOUNTER — Other Ambulatory Visit (INDEPENDENT_AMBULATORY_CARE_PROVIDER_SITE_OTHER): Payer: PPO

## 2018-10-19 DIAGNOSIS — E7800 Pure hypercholesterolemia, unspecified: Secondary | ICD-10-CM | POA: Diagnosis not present

## 2018-10-19 LAB — COMPREHENSIVE METABOLIC PANEL
ALT: 30 U/L (ref 0–35)
AST: 23 U/L (ref 0–37)
Albumin: 4.6 g/dL (ref 3.5–5.2)
Alkaline Phosphatase: 77 U/L (ref 39–117)
BUN: 14 mg/dL (ref 6–23)
CO2: 31 mEq/L (ref 19–32)
Calcium: 9.8 mg/dL (ref 8.4–10.5)
Chloride: 102 mEq/L (ref 96–112)
Creatinine, Ser: 0.9 mg/dL (ref 0.40–1.20)
GFR: 62.79 mL/min (ref >60.00)
Glucose, Bld: 104 mg/dL — ABNORMAL HIGH (ref 70–99)
Potassium: 3.7 mEq/L (ref 3.5–5.1)
Sodium: 141 mEq/L (ref 135–145)
Total Bilirubin: 0.7 mg/dL (ref 0.2–1.2)
Total Protein: 7.2 g/dL (ref 6.0–8.3)

## 2018-10-19 LAB — LIPID PANEL
Cholesterol: 187 mg/dL (ref 0–200)
HDL: 73.5 mg/dL (ref >39.00)
LDL Cholesterol: 90 mg/dL (ref 0–99)
NonHDL: 113.58
Total CHOL/HDL Ratio: 3
Triglycerides: 118 mg/dL (ref 0.0–149.0)
VLDL: 23.6 mg/dL (ref 0.0–40.0)

## 2018-10-25 ENCOUNTER — Encounter: Payer: Self-pay | Admitting: Family Medicine

## 2018-10-25 ENCOUNTER — Ambulatory Visit (INDEPENDENT_AMBULATORY_CARE_PROVIDER_SITE_OTHER): Payer: PPO | Admitting: Family Medicine

## 2018-10-25 ENCOUNTER — Other Ambulatory Visit: Payer: Self-pay

## 2018-10-25 VITALS — BP 124/72 | HR 64 | Temp 98.2°F | Ht 62.75 in | Wt 192.2 lb

## 2018-10-25 DIAGNOSIS — E7800 Pure hypercholesterolemia, unspecified: Secondary | ICD-10-CM | POA: Diagnosis not present

## 2018-10-25 DIAGNOSIS — F411 Generalized anxiety disorder: Secondary | ICD-10-CM | POA: Diagnosis not present

## 2018-10-25 DIAGNOSIS — Z0000 Encounter for general adult medical examination without abnormal findings: Secondary | ICD-10-CM

## 2018-10-25 DIAGNOSIS — Z23 Encounter for immunization: Secondary | ICD-10-CM

## 2018-10-25 DIAGNOSIS — Z85820 Personal history of malignant melanoma of skin: Secondary | ICD-10-CM | POA: Diagnosis not present

## 2018-10-25 NOTE — Addendum Note (Signed)
Addended by: Janan Ridge on: 10/25/2018 04:54 PM   Modules accepted: Orders

## 2018-10-25 NOTE — Progress Notes (Signed)
Chief Complaint  Patient presents with  . Annual Exam    AWV     History of Present Illness: HPI  The patient presents for welcome to  medicare wellness, complete physical and review of chronic health problems. He/She also has the following acute concerns today: none  I have personally reviewed the Medicare Annual Wellness questionnaire and have noted 1. The patient's medical and social history 2. Their use of alcohol, tobacco or illicit drugs 3. Their current medications and supplements 4. The patient's functional ability including ADL's, fall risks, home safety risks and hearing or visual             impairment. 5. Diet and physical activities 6. Evidence for depression or mood disorders 7.         Updated provider list Cognitive evaluation was performed and recorded on pt medicare questionnaire form. The patients weight, height, BMI and visual acuity have been recorded in the chart  I have made referrals, counseling and provided education to the patient based review of the above and I have provided the pt with a written personalized care plan for preventive services.   Documentation of this information was scanned into the electronic record under the media tab.   Advance directives and end of life planning reviewed in detail with patient and documented in EMR. Patient given handout on advance care directives if needed. HCPOA and living will updated if needed.  Fall Risk  10/25/2018  Falls in the past year? 0     Office Visit from 10/25/2018 in [place] at [facility] Total Score  0     Hypertension:   Good control on losartan daily and  HCTZ  intermittantly      BP Readings from Last 3 Encounters:  10/25/18 124/72  09/28/18 120/82  11/29/17 128/80  Using medication without problems or lightheadedness: none Chest pain with exertion:none Edema:none Short of breath:none Average home BPs: Other issues: she has less palpations with less stress.    Elevated Cholesterol: LDL at goal < 100 on lipitor 10 mg daily  Lab Results  Component Value Date   CHOL 187 10/19/2018   HDL 73.50 10/19/2018   LDLCALC 90 10/19/2018   TRIG 118.0 10/19/2018   CHOLHDL 3 10/19/2018  Using medications without problems:none Muscle aches: none Diet compliance: moderate Exercise: started back to the gym Other complaints: Wt Readings from Last 3 Encounters:  10/25/18 192 lb 4 oz (87.2 kg)  09/28/18 189 lb (85.7 kg)  11/29/17 185 lb (83.9 kg)    melanoma lower left leg 2001, left shoulder 2018 Followed by Derm Dr. Ubaldo Glassing. No new concerning lesions today.  GAD: stable control.. improved stress given retirement  Patient Care Team: Jinny Sanders, MD as PCP - General (Family Medicine)  Dr. Lilly Cove  Dr. Cristina Gong GI  COVID 19 screen No recent travel or known exposure to [place] The patient denies respiratory symptoms of COVID 19 at this time.  The importance of social distancing was discussed today.   Review of Systems  Constitutional: Negative for chills and fever.  HENT: Negative for congestion and ear pain.   Eyes: Negative for pain and redness.  Respiratory: Negative for cough and shortness of breath.   Cardiovascular: Negative for chest pain, palpitations and leg swelling.  Gastrointestinal: Negative for abdominal pain, blood in stool, constipation, diarrhea, nausea and vomiting.  Genitourinary: Negative for dysuria.  Musculoskeletal: Negative for falls and myalgias.  Skin: Negative for rash.  Neurological: Negative for  dizziness.  Psychiatric/Behavioral: Negative for depression. The patient is not nervous/anxious.       Past Medical History:  Diagnosis Date  . Abnormal Pap smear of cervix   . Cancer ([place])    melanoma lower left leg, left arm  . Difficult intubation    took 3 tries with nerve block  . Fibroid    5 cm  . Hyperlipidemia   . Hypertension   . IBS (irritable bowel syndrome)   . Shingles     reports that she has  never smoked. She has never used smokeless tobacco. She reports current alcohol use. She reports that she does not use drugs.   Current Outpatient Medications:  .  atorvastatin (LIPITOR) 10 MG tablet, Take 1 tablet (10 mg total) by mouth daily., Disp: 90 tablet, Rfl: 0 .  Coenzyme Q10 (CO Q 10 PO), Take 1 tablet by mouth. 4 times a week, Disp: , Rfl:  .  Cyanocobalamin (VITAMIN B-12 PO), Take 1 tablet by mouth daily. , Disp: , Rfl:  .  fluticasone (FLONASE) 50 MCG/ACT nasal spray, Place 2 sprays into both nostrils daily as needed. , Disp: , Rfl:  .  hydrochlorothiazide (MICROZIDE) 12.5 MG capsule, Take 1 capsule (12.5 mg total) by mouth every other day., Disp: 45 capsule, Rfl: 0 .  Ibuprofen (ADVIL PO), Take by mouth as needed., Disp: , Rfl:  .  losartan (COZAAR) 100 MG tablet, Take 0.5 tablets (50 mg total) by mouth daily., Disp: 45 tablet, Rfl: 0 .  montelukast (SINGULAIR) 10 MG tablet, Take 1 tablet (10 mg total) by mouth daily as needed., Disp: 90 tablet, Rfl: 0 .  Multiple Vitamins-Minerals (MULTIVITAMIN PO), Take by mouth daily., Disp: , Rfl:  .  valACYclovir (VALTREX) 1000 MG tablet, Take one for 3 days at onset of outbreak, Disp: 90 tablet, Rfl: 1 .  VITAMIN D, ERGOCALCIFEROL, PO, Take 1,000 Int'l Units by mouth daily. , Disp: , Rfl:    Observations/Objective: Blood pressure 124/72, pulse 64, temperature 98.2 F (36.8 C), temperature source Temporal, height 5' 2.75" (1.594 m), weight 192 lb 4 oz (87.2 kg), last menstrual period 04/13/2004, SpO2 97 %.  Hearing: normal gross hearing Wears glasses// last eye exam Fall 2019  Physical Exam Constitutional:      General: She is not in acute distress.    Appearance: Normal appearance. She is well-developed. She is not ill-appearing or toxic-appearing.  HENT:     Head: Normocephalic.     Right Ear: Hearing, tympanic membrane, ear canal and external ear normal.     Left Ear: Hearing, tympanic membrane, ear canal and external ear normal.      Nose: Nose normal.  Eyes:     General: Lids are normal. Lids are everted, no foreign bodies appreciated.     Conjunctiva/sclera: Conjunctivae normal.     Pupils: Pupils are equal, round, and reactive to light.  Neck:     Musculoskeletal: Normal range of motion and neck supple.     Thyroid: No thyroid mass or thyromegaly.     Vascular: No carotid bruit.     Trachea: Trachea normal.  Cardiovascular:     Rate and Rhythm: Normal rate and regular rhythm.     Heart sounds: Normal heart sounds, S1 normal and S2 normal. No murmur. No gallop.   Pulmonary:     Effort: Pulmonary effort is normal. No respiratory distress.     Breath sounds: Normal breath sounds. No wheezing, rhonchi or rales.  Abdominal:  General: Bowel sounds are normal. There is no distension or abdominal bruit.     Palpations: Abdomen is soft. There is no fluid wave or mass.     Tenderness: There is no abdominal tenderness. There is no guarding or rebound.     Hernia: No hernia is present.  Lymphadenopathy:     Cervical: No cervical adenopathy.  Skin:    General: Skin is warm and dry.     Findings: No rash.  Neurological:     Mental Status: She is alert.     Cranial Nerves: No cranial nerve deficit.     Sensory: No sensory deficit.  Psychiatric:        Mood and Affect: Mood is not anxious or depressed.        Speech: Speech normal.        Behavior: Behavior normal. Behavior is cooperative.        Judgment: Judgment normal.      Assessment and Plan   The patient's preventative maintenance and recommended screening tests for an annual wellness exam were reviewed in full today. Brought up to date unless services declined.  Counselled on the importance of diet, exercise, and its role in overall health and mortality. The patient's FH and SH was reviewed, including their home life, tobacco status, and drug and alcohol status.   Vaccines: Uptodate tdap, had shingles vaccine in 2015, due for prevnar Pap/DVE:  Per  GYN, 09/28/2018 Mammo: per GYN 02/2018, yearly Bone Density: 2016 normal.repeat in 5 years. Colon:  Mother colon cancer repeat q 5 years , Buccini.. 01/07/2018 Smoking Status:none ETOH/ drug use: rare/ none Hep C: neg HIV screen: neg   Eliezer Lofts, MD

## 2018-10-25 NOTE — Patient Instructions (Signed)
Goal exercise 3-5 times a week.  Work on low carbohydrate diet.  Goal 10% weight loss.

## 2018-12-18 ENCOUNTER — Other Ambulatory Visit: Payer: Self-pay | Admitting: Family Medicine

## 2018-12-28 ENCOUNTER — Telehealth: Payer: Self-pay

## 2018-12-28 NOTE — Telephone Encounter (Signed)
Pt called and pt wanted to know if she had to get a second Prevnar 13; I advised pt she got Prevnar 13 on 10/25/18 and would not need another Prevnar 13 but Dr Diona Browner may want pt to have a Pneumovax 23 after one yr from getting the prevnar 37. Pt wanted to verify it would be 1 yr instead of 6 mths and I advised pt that yes after taking the Prevnar 13 she should wait 1 calendar yr before getting the Pneumovax 23 if Dr Diona Browner agrees pt needs to get the immunization. Pt said she would ck at next annual exam in July 2021 with Dr Diona Browner. Pt also wanted me to chart that she had the high dose [facility] at [facility] earlier today. Charted in pts immunization list. Nothing further needed.

## 2019-01-12 DIAGNOSIS — L821 Other seborrheic keratosis: Secondary | ICD-10-CM | POA: Diagnosis not present

## 2019-01-12 DIAGNOSIS — Z85820 Personal history of malignant melanoma of skin: Secondary | ICD-10-CM | POA: Diagnosis not present

## 2019-01-12 DIAGNOSIS — D2271 Melanocytic nevi of right lower limb, including hip: Secondary | ICD-10-CM | POA: Diagnosis not present

## 2019-01-12 DIAGNOSIS — D2272 Melanocytic nevi of left lower limb, including hip: Secondary | ICD-10-CM | POA: Diagnosis not present

## 2019-01-12 DIAGNOSIS — L820 Inflamed seborrheic keratosis: Secondary | ICD-10-CM | POA: Diagnosis not present

## 2019-01-12 DIAGNOSIS — D225 Melanocytic nevi of trunk: Secondary | ICD-10-CM | POA: Diagnosis not present

## 2019-01-12 DIAGNOSIS — L814 Other melanin hyperpigmentation: Secondary | ICD-10-CM | POA: Diagnosis not present

## 2019-01-12 DIAGNOSIS — D2262 Melanocytic nevi of left upper limb, including shoulder: Secondary | ICD-10-CM | POA: Diagnosis not present

## 2019-01-12 DIAGNOSIS — D1801 Hemangioma of skin and subcutaneous tissue: Secondary | ICD-10-CM | POA: Diagnosis not present

## 2019-01-12 DIAGNOSIS — D2261 Melanocytic nevi of right upper limb, including shoulder: Secondary | ICD-10-CM | POA: Diagnosis not present

## 2019-01-24 ENCOUNTER — Other Ambulatory Visit: Payer: Self-pay | Admitting: Family Medicine

## 2019-01-24 DIAGNOSIS — Z1231 Encounter for screening mammogram for malignant neoplasm of breast: Secondary | ICD-10-CM

## 2019-02-03 DIAGNOSIS — H2513 Age-related nuclear cataract, bilateral: Secondary | ICD-10-CM | POA: Diagnosis not present

## 2019-02-03 DIAGNOSIS — H524 Presbyopia: Secondary | ICD-10-CM | POA: Diagnosis not present

## 2019-02-03 DIAGNOSIS — H11062 Recurrent pterygium of left eye: Secondary | ICD-10-CM | POA: Diagnosis not present

## 2019-02-27 ENCOUNTER — Encounter: Payer: Self-pay | Admitting: Internal Medicine

## 2019-02-27 ENCOUNTER — Ambulatory Visit (INDEPENDENT_AMBULATORY_CARE_PROVIDER_SITE_OTHER): Payer: PPO | Admitting: Internal Medicine

## 2019-02-27 VITALS — HR 79 | Temp 99.5°F

## 2019-02-27 DIAGNOSIS — H938X2 Other specified disorders of left ear: Secondary | ICD-10-CM

## 2019-02-27 DIAGNOSIS — R05 Cough: Secondary | ICD-10-CM

## 2019-02-27 DIAGNOSIS — R059 Cough, unspecified: Secondary | ICD-10-CM

## 2019-02-27 DIAGNOSIS — R519 Headache, unspecified: Secondary | ICD-10-CM | POA: Diagnosis not present

## 2019-02-27 DIAGNOSIS — J029 Acute pharyngitis, unspecified: Secondary | ICD-10-CM | POA: Diagnosis not present

## 2019-02-27 DIAGNOSIS — R52 Pain, unspecified: Secondary | ICD-10-CM | POA: Diagnosis not present

## 2019-02-27 NOTE — Patient Instructions (Signed)

## 2019-02-27 NOTE — Progress Notes (Signed)
Virtual Visit via Video Note  I connected with Beverly Quinn on 02/27/19 at  4:00 PM EST by a video enabled telemedicine application and verified that I am speaking with the correct person using two identifiers.  Location: Patient: Home Provider: Office   I discussed the limitations of evaluation and management by telemedicine and the availability of in person appointments. The patient expressed understanding and agreed to proceed.  History of Present Illness:  Pt reports headache, sore throat cough and epigastric discomfort. This started this morning. The headache is located on the left side of her head. She describes the pain as dull and achy. She denies dizziness, sensitivity to light and sound, nausea or vomiting. She has a sore throat which she describes as scratchy. She has a dry non productive cough. She reports some left ear fullness but denies ear pain or drainage. She denies runny nose, nasal congestion, loss of taste or smell or shortness of breath. She has had epigastric discomfort, but denies constipation, diarrhea or blood in her stool. She denies fever or chills but has had some body aches. She has not had sick contacts or exposure to COVID that she knows of. She has had allergies in the past.    Past Medical History:  Diagnosis Date  . Abnormal Pap smear of cervix   . Cancer ([place])    melanoma lower left leg, left arm  . Difficult intubation    took 3 tries with nerve block  . Fibroid    5 cm  . Hyperlipidemia   . Hypertension   . IBS (irritable bowel syndrome)   . Shingles     Current Outpatient Medications  Medication Sig Dispense Refill  . atorvastatin (LIPITOR) 10 MG tablet TAKE 1 TABLET BY MOUTH EVERY DAY 90 tablet 3  . Coenzyme Q10 (CO Q 10 PO) Take 1 tablet by mouth. 4 times a week    . Cyanocobalamin (VITAMIN B-12 PO) Take 1 tablet by mouth daily.     . fluticasone (FLONASE) 50 MCG/ACT nasal spray Place 2 sprays into both nostrils daily as needed.     .  hydrochlorothiazide (MICROZIDE) 12.5 MG capsule TAKE 1 CAPSULE (12.5 MG TOTAL) BY MOUTH EVERY OTHER DAY. 45 capsule 1  . Ibuprofen (ADVIL PO) Take by mouth as needed.    Marland Kitchen losartan (COZAAR) 100 MG tablet TAKE 1/2 TABLET BY MOUTH DAILY 45 tablet 1  . montelukast (SINGULAIR) 10 MG tablet TAKE 1 TABLET BY MOUTH EVERY DAY AS NEEDED 90 tablet 1  . Multiple Vitamins-Minerals (MULTIVITAMIN PO) Take by mouth daily.    . valACYclovir (VALTREX) 1000 MG tablet Take one for 3 days at onset of outbreak 90 tablet 1  . VITAMIN D, ERGOCALCIFEROL, PO Take 1,000 Int'l Units by mouth daily.      No current facility-administered medications for this visit.     Allergies  Allergen Reactions  . Doxycycline Other (See Comments)    dizziness  . Hydrocodone     Flu like symptoms    Family History  Problem Relation Age of Onset  . Cancer Mother        uterine & colon  . Hypertension Mother   . Cancer Sister        lung  . Hypertension Sister     Social History   Socioeconomic History  . Marital status: Divorced    Spouse name: Not on file  . Number of children: Not on file  . Years of education: Not on file  .  Highest education level: Not on file  Occupational History  . Not on file  Social Needs  . Financial resource strain: Not on file  . Food insecurity    Worry: Not on file    Inability: Not on file  . Transportation needs    Medical: Not on file    Non-medical: Not on file  Tobacco Use  . Smoking status: Never Smoker  . Smokeless tobacco: Never Used  Substance and Sexual Activity  . Alcohol use: Yes    Comment: 2 a week  . Drug use: No  . Sexual activity: Not Currently    Partners: Male    Birth control/protection: Surgical, Post-menopausal    Comment: BTL  Lifestyle  . Physical activity    Days per week: Not on file    Minutes per session: Not on file  . Stress: Not on file  Relationships  . Social Herbalist on phone: Not on file    Gets together: Not on file     Attends religious service: Not on file    Active member of club or organization: Not on file    Attends meetings of clubs or organizations: Not on file    Relationship status: Not on file  . Intimate partner violence    Fear of current or ex partner: Not on file    Emotionally abused: Not on file    Physically abused: Not on file    Forced sexual activity: Not on file  Other Topics Concern  . Not on file  Social History Narrative  . Not on file     Constitutional: Pt reports headache and fever. Denies malaise, fatigue, or abrupt weight changes.  HEENT: Pt reports ear fullness, sore throat. Denies eye pain, eye redness, ear pain, ringing in the ears, wax buildup, runny nose, nasal congestion, bloody nose. Respiratory: Pt reports cough. Denies difficulty breathing, shortness of breath,  or sputum production.   Cardiovascular: Denies chest pain, chest tightness, palpitations or swelling in the hands or feet.  Gastrointestinal: Pt reports epigastric discomfort. Denies  bloating, constipation, diarrhea or blood in the stool.  GU: Denies urgency, frequency, pain with urination, burning sensation, blood in urine, odor or discharge. Musculoskeletal: Pt reports body aches. Denies decrease in range of motion, difficulty with gait,  or joint pain and swelling.  Skin: Denies redness, rashes, lesions or ulcercations.   No other specific complaints in a complete review of systems (except as listed in HPI above).  Objective  Pulse 79   Temp 99.5 F (37.5 C) (Oral)   LMP 04/13/2004   SpO2 96%   Wt Readings from Last 3 Encounters:  10/25/18 192 lb 4 oz (87.2 kg)  09/28/18 189 lb (85.7 kg)  11/29/17 185 lb (83.9 kg)    General: Appears her stated age, well developed, well nourished in NAD. HEENT: Head: normal shape and size; Pulmonary/Chest: Normal effort. No respiratory distress.  Neurological: Alert and oriented.   BMET    Component Value Date/Time   NA 141 10/19/2018 0807   K  3.7 10/19/2018 0807   CL 102 10/19/2018 0807   CO2 31 10/19/2018 0807   GLUCOSE 104 (H) 10/19/2018 0807   BUN 14 10/19/2018 0807   CREATININE 0.90 10/19/2018 0807   CALCIUM 9.8 10/19/2018 0807    Lipid Panel     Component Value Date/Time   CHOL 187 10/19/2018 0807   TRIG 118.0 10/19/2018 0807   HDL 73.50 10/19/2018 0807   CHOLHDL  3 10/19/2018 0807   VLDL 23.6 10/19/2018 0807   LDLCALC 90 10/19/2018 0807    CBC    Component Value Date/Time   WBC 5.4 02/19/2014 1455   RBC 5.43 (H) 02/19/2014 1455   HGB 15.9 (H) 02/19/2014 1455   HCT 46.5 (H) 02/19/2014 1455   PLT 246 02/19/2014 1455   MCV 85.6 02/19/2014 1455   MCH 29.3 02/19/2014 1455   MCHC 34.2 02/19/2014 1455   RDW 13.1 02/19/2014 1455    Hgb A1C No results found for: HGBA1C     Assessment and Plan:  Acute Headache, Left Ear Fullness, Sore Throat, Cough, Body Aches:  DDX include allergies, viral URI vs COVID 19 Continue Singulair as prescribed Start Flonase OTC She will get tested if she feels like her symptoms are worse Encouraged frequent hand washing, self quarantine, masking and social distancing until symptoms resolve ER precautions discussed   Follow Up Instructions:    I discussed the assessment and treatment plan with the patient. The patient was provided an opportunity to ask questions and all were answered. The patient agreed with the plan and demonstrated an understanding of the instructions.   The patient was advised to call back or seek an in-person evaluation if the symptoms worsen or if the condition fails to improve as anticipated.     Webb Silversmith, NP

## 2019-03-06 ENCOUNTER — Ambulatory Visit (INDEPENDENT_AMBULATORY_CARE_PROVIDER_SITE_OTHER): Payer: PPO | Admitting: Internal Medicine

## 2019-03-06 ENCOUNTER — Encounter: Payer: Self-pay | Admitting: Internal Medicine

## 2019-03-06 DIAGNOSIS — R430 Anosmia: Secondary | ICD-10-CM | POA: Diagnosis not present

## 2019-03-06 DIAGNOSIS — R432 Parageusia: Secondary | ICD-10-CM

## 2019-03-06 NOTE — Progress Notes (Signed)
Virtual Visit via Video Note  I connected with Beverly Quinn on 03/06/19 at  2:15 PM EST by a video enabled telemedicine application and verified that I am speaking with the correct person using two identifiers.  Location: Patient: Home Provider: Office   I discussed the limitations of evaluation and management by telemedicine and the availability of in person appointments. The patient expressed understanding and agreed to proceed.  History of Present Illness:  Pt reports loss of smell or taste in addition to headache, let ear fullness, sore throat, cough and body aches. These symptoms started 8 day ago. She reports the loss of smell and taste are new in the last 2 days. She denies fever, chills, runny nose, nasal congestion, shortness of breath. She has been taking Vitamin C, Zinc, Ibuprofen with some relief. She is currently working from home. She wants to know if she should get COVID tested.  Past Medical History:  Diagnosis Date  . Abnormal Pap smear of cervix   . Cancer ([place])    melanoma lower left leg, left arm  . Difficult intubation    took 3 tries with nerve block  . Fibroid    5 cm  . Hyperlipidemia   . Hypertension   . IBS (irritable bowel syndrome)   . Shingles     Current Outpatient Medications  Medication Sig Dispense Refill  . atorvastatin (LIPITOR) 10 MG tablet TAKE 1 TABLET BY MOUTH EVERY DAY 90 tablet 3  . Coenzyme Q10 (CO Q 10 PO) Take 1 tablet by mouth. 4 times a week    . Cyanocobalamin (VITAMIN B-12 PO) Take 1 tablet by mouth daily.     . fluticasone (FLONASE) 50 MCG/ACT nasal spray Place 2 sprays into both nostrils daily as needed.     . hydrochlorothiazide (MICROZIDE) 12.5 MG capsule TAKE 1 CAPSULE (12.5 MG TOTAL) BY MOUTH EVERY OTHER DAY. 45 capsule 1  . Ibuprofen (ADVIL PO) Take by mouth as needed.    Marland Kitchen losartan (COZAAR) 100 MG tablet TAKE 1/2 TABLET BY MOUTH DAILY 45 tablet 1  . montelukast (SINGULAIR) 10 MG tablet TAKE 1 TABLET BY MOUTH EVERY DAY AS  NEEDED 90 tablet 1  . Multiple Vitamins-Minerals (MULTIVITAMIN PO) Take by mouth daily.    . valACYclovir (VALTREX) 1000 MG tablet Take one for 3 days at onset of outbreak 90 tablet 1  . VITAMIN D, ERGOCALCIFEROL, PO Take 1,000 Int'l Units by mouth daily.      No current facility-administered medications for this visit.     Allergies  Allergen Reactions  . Doxycycline Other (See Comments)    dizziness  . Hydrocodone     Flu like symptoms    Family History  Problem Relation Age of Onset  . Cancer Mother        uterine & colon  . Hypertension Mother   . Cancer Sister        lung  . Hypertension Sister     Social History   Socioeconomic History  . Marital status: Divorced    Spouse name: Not on file  . Number of children: Not on file  . Years of education: Not on file  . Highest education level: Not on file  Occupational History  . Not on file  Social Needs  . Financial resource strain: Not on file  . Food insecurity    Worry: Not on file    Inability: Not on file  . Transportation needs    Medical: Not on file  Non-medical: Not on file  Tobacco Use  . Smoking status: Never Smoker  . Smokeless tobacco: Never Used  Substance and Sexual Activity  . Alcohol use: Yes    Comment: 2 a week  . Drug use: No  . Sexual activity: Not Currently    Partners: Male    Birth control/protection: Surgical, Post-menopausal    Comment: BTL  Lifestyle  . Physical activity    Days per week: Not on file    Minutes per session: Not on file  . Stress: Not on file  Relationships  . Social Herbalist on phone: Not on file    Gets together: Not on file    Attends religious service: Not on file    Active member of club or organization: Not on file    Attends meetings of clubs or organizations: Not on file    Relationship status: Not on file  . Intimate partner violence    Fear of current or ex partner: Not on file    Emotionally abused: Not on file    Physically  abused: Not on file    Forced sexual activity: Not on file  Other Topics Concern  . Not on file  Social History Narrative  . Not on file     Constitutional: Denies fever, malaise, fatigue, headache or abrupt weight changes.  HEENT: Pt reports loss of taste and smell. Denies eye pain, eye redness, ear pain, ringing in the ears, wax buildup, runny nose, nasal congestion, bloody nose, or sore throat. Respiratory: Denies difficulty breathing, shortness of breath, cough or sputum production.   Cardiovascular: Denies chest pain, chest tightness, palpitations or swelling in the hands or feet.   No other specific complaints in a complete review of systems (except as listed in HPI above).  Observations/Objective:  LMP 04/13/2004  Wt Readings from Last 3 Encounters:  10/25/18 192 lb 4 oz (87.2 kg)  09/28/18 189 lb (85.7 kg)  11/29/17 185 lb (83.9 kg)    General: Appears her stated age, well developed, well nourished in NAD. Skin: No rashes noted. Pulmonary/Chest: Normal effort. No respiratory distress.  Neurological: Alert and oriented.  BMET    Component Value Date/Time   NA 141 10/19/2018 0807   K 3.7 10/19/2018 0807   CL 102 10/19/2018 0807   CO2 31 10/19/2018 0807   GLUCOSE 104 (H) 10/19/2018 0807   BUN 14 10/19/2018 0807   CREATININE 0.90 10/19/2018 0807   CALCIUM 9.8 10/19/2018 0807    Lipid Panel     Component Value Date/Time   CHOL 187 10/19/2018 0807   TRIG 118.0 10/19/2018 0807   HDL 73.50 10/19/2018 0807   CHOLHDL 3 10/19/2018 0807   VLDL 23.6 10/19/2018 0807   LDLCALC 90 10/19/2018 0807    CBC    Component Value Date/Time   WBC 5.4 02/19/2014 1455   RBC 5.43 (H) 02/19/2014 1455   HGB 15.9 (H) 02/19/2014 1455   HCT 46.5 (H) 02/19/2014 1455   PLT 246 02/19/2014 1455   MCV 85.6 02/19/2014 1455   MCH 29.3 02/19/2014 1455   MCHC 34.2 02/19/2014 1455   RDW 13.1 02/19/2014 1455    Hgb A1C No results found for: HGBA1C     Assessment and  Plan:  Loss of Taste and Smell:  She likely has COVID 19 Symptoms are improving but lingering Discussed the reason why we would not recommend testing at this time due to increased wait times at drive through testing given her  mild symptoms Continue Vit C and Zinc Quarantine for 14 days after symptom onset Encouraged masking, social distancing, handwashing Will obtain Sars COVID IgG and IgM  Follow Up Instructions:    I discussed the assessment and treatment plan with the patient. The patient was provided an opportunity to ask questions and all were answered. The patient agreed with the plan and demonstrated an understanding of the instructions.   The patient was advised to call back or seek an in-person evaluation if the symptoms worsen or if the condition fails to improve as anticipated.    Webb Silversmith, NP

## 2019-03-06 NOTE — Patient Instructions (Signed)
COVID-19: How to Protect Yourself and Others Know how it spreads  There is currently no vaccine to prevent coronavirus disease 2019 (COVID-19).  The best way to prevent illness is to avoid being exposed to this virus.  The virus is thought to spread mainly from person-to-person. ? Between people who are in close contact with one another (within about 6 feet). ? Through respiratory droplets produced when an infected person coughs, sneezes or talks. ? These droplets can land in the mouths or noses of people who are nearby or possibly be inhaled into the lungs. ? Some recent studies have suggested that COVID-19 may be spread by people who are not showing symptoms. Everyone should Clean your hands often  Wash your hands often with soap and water for at least 20 seconds especially after you have been in a public place, or after blowing your nose, coughing, or sneezing.  If soap and water are not readily available, use a hand sanitizer that contains at least 60% alcohol. Cover all surfaces of your hands and rub them together until they feel dry.  Avoid touching your eyes, nose, and mouth with unwashed hands. Avoid close contact  Stay home if you are sick.  Avoid close contact with people who are sick.  Put distance between yourself and other people. ? Remember that some people without symptoms may be able to spread virus. ? This is especially important for people who are at higher risk of getting very sick.www.cdc.gov/coronavirus/2019-ncov/need-extra-precautions/people-at-higher-risk.html Cover your mouth and nose with a cloth face cover when around others  You could spread COVID-19 to others even if you do not feel sick.  Everyone should wear a cloth face cover when they have to go out in public, for example to the grocery store or to pick up other necessities. ? Cloth face coverings should not be placed on young children under age 2, anyone who has trouble breathing, or is unconscious,  incapacitated or otherwise unable to remove the mask without assistance.  The cloth face cover is meant to protect other people in case you are infected.  Do NOT use a facemask meant for a healthcare worker.  Continue to keep about 6 feet between yourself and others. The cloth face cover is not a substitute for social distancing. Cover coughs and sneezes  If you are in a private setting and do not have on your cloth face covering, remember to always cover your mouth and nose with a tissue when you cough or sneeze or use the inside of your elbow.  Throw used tissues in the trash.  Immediately wash your hands with soap and water for at least 20 seconds. If soap and water are not readily available, clean your hands with a hand sanitizer that contains at least 60% alcohol. Clean and disinfect  Clean AND disinfect frequently touched surfaces daily. This includes tables, doorknobs, light switches, countertops, handles, desks, phones, keyboards, toilets, faucets, and sinks. www.cdc.gov/coronavirus/2019-ncov/prevent-getting-sick/disinfecting-your-home.html  If surfaces are dirty, clean them: Use detergent or soap and water prior to disinfection.  Then, use a household disinfectant. You can see a list of EPA-registered household disinfectants here. cdc.gov/coronavirus 08/16/2018 This information is not intended to replace advice given to you by your health care provider. Make sure you discuss any questions you have with your health care provider. Document Released: 07/26/2018 Document Revised: 08/24/2018 Document Reviewed: 07/26/2018 Elsevier Patient Education  2020 [employer]  

## 2019-03-08 ENCOUNTER — Other Ambulatory Visit: Payer: Self-pay

## 2019-03-08 ENCOUNTER — Ambulatory Visit: Payer: PPO

## 2019-03-24 ENCOUNTER — Other Ambulatory Visit (INDEPENDENT_AMBULATORY_CARE_PROVIDER_SITE_OTHER): Payer: PPO

## 2019-03-24 ENCOUNTER — Other Ambulatory Visit: Payer: Self-pay

## 2019-03-24 DIAGNOSIS — R430 Anosmia: Secondary | ICD-10-CM | POA: Diagnosis not present

## 2019-03-24 DIAGNOSIS — R432 Parageusia: Secondary | ICD-10-CM | POA: Diagnosis not present

## 2019-03-25 LAB — SARS COV-2 SEROLOGY(COVID-19)AB(IGG,IGM),IMMUNOASSAY
SARS CoV-2 AB IgG: POSITIVE — AB
SARS CoV-2 IgM: POSITIVE — AB

## 2019-03-27 ENCOUNTER — Encounter: Payer: Self-pay | Admitting: Internal Medicine

## 2019-04-24 ENCOUNTER — Other Ambulatory Visit: Payer: Self-pay

## 2019-04-24 ENCOUNTER — Ambulatory Visit
Admission: RE | Admit: 2019-04-24 | Discharge: 2019-04-24 | Disposition: A | Discharge status: Home / Self Care | Payer: PPO | Source: Ambulatory Visit | Attending: Family Medicine | Admitting: Family Medicine

## 2019-04-24 DIAGNOSIS — Z1231 Encounter for screening mammogram for malignant neoplasm of breast: Secondary | ICD-10-CM | POA: Diagnosis not present

## 2019-05-11 DIAGNOSIS — L570 Actinic keratosis: Secondary | ICD-10-CM | POA: Diagnosis not present

## 2019-07-04 ENCOUNTER — Encounter: Payer: Self-pay | Admitting: Certified Nurse Midwife

## 2019-07-19 ENCOUNTER — Telehealth: Payer: Self-pay | Admitting: Family Medicine

## 2019-07-19 NOTE — Telephone Encounter (Signed)
Beverly Quinn,  Please clarify.  [place] [facility] is the vaccine not documented there at the time the vaccine is given or was it given through the [facility]?  I have added the vaccine to her immunization record but I was confused.

## 2019-07-19 NOTE — Telephone Encounter (Signed)
t received 1st dose of Covid

## 2019-08-02 DIAGNOSIS — D1801 Hemangioma of skin and subcutaneous tissue: Secondary | ICD-10-CM | POA: Diagnosis not present

## 2019-08-02 DIAGNOSIS — L814 Other melanin hyperpigmentation: Secondary | ICD-10-CM | POA: Diagnosis not present

## 2019-08-02 DIAGNOSIS — Z85820 Personal history of malignant melanoma of skin: Secondary | ICD-10-CM | POA: Diagnosis not present

## 2019-08-02 DIAGNOSIS — L239 Allergic contact dermatitis, unspecified cause: Secondary | ICD-10-CM | POA: Diagnosis not present

## 2019-08-02 DIAGNOSIS — D2262 Melanocytic nevi of left upper limb, including shoulder: Secondary | ICD-10-CM | POA: Diagnosis not present

## 2019-08-02 DIAGNOSIS — D2272 Melanocytic nevi of left lower limb, including hip: Secondary | ICD-10-CM | POA: Diagnosis not present

## 2019-08-02 DIAGNOSIS — L821 Other seborrheic keratosis: Secondary | ICD-10-CM | POA: Diagnosis not present

## 2019-08-02 DIAGNOSIS — D2261 Melanocytic nevi of right upper limb, including shoulder: Secondary | ICD-10-CM | POA: Diagnosis not present

## 2019-08-02 DIAGNOSIS — D2271 Melanocytic nevi of right lower limb, including hip: Secondary | ICD-10-CM | POA: Diagnosis not present

## 2019-08-02 DIAGNOSIS — D225 Melanocytic nevi of trunk: Secondary | ICD-10-CM | POA: Diagnosis not present

## 2019-08-15 ENCOUNTER — Other Ambulatory Visit: Payer: Self-pay | Admitting: Family Medicine

## 2019-11-16 ENCOUNTER — Other Ambulatory Visit: Payer: Self-pay | Admitting: Family Medicine

## 2019-11-25 ENCOUNTER — Other Ambulatory Visit: Payer: Self-pay | Admitting: Family Medicine

## 2019-12-25 ENCOUNTER — Telehealth: Payer: Self-pay | Admitting: Family Medicine

## 2019-12-25 ENCOUNTER — Ambulatory Visit: Payer: PPO

## 2019-12-25 ENCOUNTER — Other Ambulatory Visit: Payer: Self-pay

## 2019-12-25 ENCOUNTER — Other Ambulatory Visit (INDEPENDENT_AMBULATORY_CARE_PROVIDER_SITE_OTHER): Payer: PPO

## 2019-12-25 DIAGNOSIS — Z79899 Other long term (current) drug therapy: Secondary | ICD-10-CM

## 2019-12-25 DIAGNOSIS — E785 Hyperlipidemia, unspecified: Secondary | ICD-10-CM | POA: Diagnosis not present

## 2019-12-25 LAB — BASIC METABOLIC PANEL
BUN: 15 mg/dL (ref 6–23)
CO2: 31 mEq/L (ref 19–32)
Calcium: 9.7 mg/dL (ref 8.4–10.5)
Chloride: 102 mEq/L (ref 96–112)
Creatinine, Ser: 0.82 mg/dL (ref 0.40–1.20)
GFR: 69.66 mL/min (ref >60.00)
Glucose, Bld: 86 mg/dL (ref 70–99)
Potassium: 4.2 mEq/L (ref 3.5–5.1)
Sodium: 140 mEq/L (ref 135–145)

## 2019-12-25 LAB — CBC WITH DIFFERENTIAL/PLATELET
Basophils Absolute: 0 10*3/uL (ref 0.0–0.1)
Basophils Relative: 0.3 % (ref 0.0–3.0)
Eosinophils Absolute: 0.1 10*3/uL (ref 0.0–0.7)
Eosinophils Relative: 2.1 % (ref 0.0–5.0)
HCT: 45.5 % (ref 36.0–46.0)
Hemoglobin: 15 g/dL (ref 12.0–15.0)
Lymphocytes Relative: 49.5 % — ABNORMAL HIGH (ref 12.0–46.0)
Lymphs Abs: 2.4 10*3/uL (ref 0.7–4.0)
MCHC: 33 g/dL (ref 30.0–36.0)
MCV: 87.6 fl (ref 78.0–100.0)
Monocytes Absolute: 0.4 10*3/uL (ref 0.1–1.0)
Monocytes Relative: 9 % (ref 3.0–12.0)
Neutro Abs: 1.9 10*3/uL (ref 1.4–7.7)
Neutrophils Relative %: 39.1 % — ABNORMAL LOW (ref 43.0–77.0)
Platelets: 255 10*3/uL (ref 150.0–400.0)
RBC: 5.2 Mil/uL — ABNORMAL HIGH (ref 3.87–5.11)
RDW: 13.7 % (ref 11.5–15.5)
WBC: 4.9 10*3/uL (ref 4.0–10.5)

## 2019-12-25 LAB — HEPATIC FUNCTION PANEL
ALT: 18 U/L (ref 0–35)
AST: 16 U/L (ref 0–37)
Albumin: 4.2 g/dL (ref 3.5–5.2)
Alkaline Phosphatase: 76 U/L (ref 39–117)
Bilirubin, Direct: 0.1 mg/dL (ref 0.0–0.3)
Total Bilirubin: 0.8 mg/dL (ref 0.2–1.2)
Total Protein: 6.2 g/dL (ref 6.0–8.3)

## 2019-12-25 LAB — LIPID PANEL
Cholesterol: 179 mg/dL (ref 0–200)
HDL: 66.5 mg/dL (ref >39.00)
LDL Cholesterol: 92 mg/dL (ref 0–99)
NonHDL: 112.9
Total CHOL/HDL Ratio: 3
Triglycerides: 105 mg/dL (ref 0.0–149.0)
VLDL: 21 mg/dL (ref 0.0–40.0)

## 2019-12-25 NOTE — Telephone Encounter (Signed)
-----   Message from Cloyd Stagers, RT sent at 12/11/2019  1:47 PM EDT ----- Regarding: Lab Orders for Monday 9.13.2021 Please place lab orders for Monday 9.13.2021, office visit for physical on Thursday 9.16.2021 Thank you, Dyke Maes RT(R)

## 2019-12-25 NOTE — Progress Notes (Signed)
No critical labs need to be addressed urgently. We will discuss labs in detail at upcoming office visit.   

## 2019-12-26 ENCOUNTER — Ambulatory Visit (INDEPENDENT_AMBULATORY_CARE_PROVIDER_SITE_OTHER): Payer: PPO

## 2019-12-26 VITALS — Wt 181.0 lb

## 2019-12-26 DIAGNOSIS — Z0000 Encounter for general adult medical examination without abnormal findings: Secondary | ICD-10-CM

## 2019-12-26 NOTE — Progress Notes (Signed)
PCP notes:  Health Maintenance: Pneumococcal 23- due Flu- due Shingrix- due   Abnormal Screenings: none   Patient concerns: none   Nurse concerns: none   Next PCP appt: 12/28/2019 @ 11 am

## 2019-12-26 NOTE — Progress Notes (Signed)
Subjective:   Beverly Quinn is a 64 y.o. female who presents for Medicare Annual (Subsequent) preventive examination.  Review of Systems: N/A      I connected with the patient today by telephone and verified that I am speaking with the correct person using two identifiers. Location patient: home Location nurse: work Persons participating in the telephone visit: patient, nurse.   I discussed the limitations, risks, security and privacy concerns of performing an evaluation and management service by telephone and the availability of in person appointments. I also discussed with the patient that there may be a patient responsible charge related to this service. The patient expressed understanding and verbally consented to this telephonic visit.        Cardiac Risk Factors include: advanced age (>50men, >60 women);hypertension;dyslipidemia     Objective:    Today's Vitals   12/26/19 0810  Weight: 181 lb (82.1 kg)   Body mass index is 32.32 kg/m.  Advanced Directives 12/26/2019  Does Patient Have a Medical Advance Directive? Yes  Type of Paramedic of [place];Living will  Copy of [place] in Chart? No - copy requested    Current Medications (verified) Outpatient Encounter Medications as of 12/26/2019  Medication Sig  . atorvastatin (LIPITOR) 10 MG tablet TAKE 1 TABLET BY MOUTH EVERY DAY  . Coenzyme Q10 (CO Q 10 PO) Take 1 tablet by mouth. 4 times a week  . Cyanocobalamin (VITAMIN B-12 PO) Take 1 tablet by mouth daily.   . fluticasone (FLONASE) 50 MCG/ACT nasal spray Place 2 sprays into both nostrils daily as needed.   . hydrochlorothiazide (MICROZIDE) 12.5 MG capsule TAKE 1 CAPSULE (12.5 MG TOTAL) BY MOUTH EVERY OTHER DAY.  Marland Kitchen Ibuprofen (ADVIL PO) Take by mouth as needed.  Marland Kitchen losartan (COZAAR) 100 MG tablet TAKE 1/2 TABLET BY MOUTH EVERY DAY  . montelukast (SINGULAIR) 10 MG tablet TAKE 1 TABLET BY MOUTH EVERY DAY AS NEEDED  . Multiple  Vitamins-Minerals (MULTIVITAMIN PO) Take by mouth daily.  . valACYclovir (VALTREX) 1000 MG tablet Take one for 3 days at onset of outbreak  . VITAMIN D, ERGOCALCIFEROL, PO Take 1,000 Int'l Units by mouth daily.    No facility-administered encounter medications on file as of 12/26/2019.    Allergies (verified) Doxycycline and Hydrocodone   History: Past Medical History:  Diagnosis Date  . Abnormal Pap smear of cervix   . Cancer ([place])    melanoma lower left leg, left arm  . Difficult intubation    took 3 tries with nerve block  . Fibroid    5 cm  . Hyperlipidemia   . Hypertension   . IBS (irritable bowel syndrome)   . Shingles    Past Surgical History:  Procedure Laterality Date  . CARPAL TUNNEL RELEASE  2004   left & right  . CERVICAL BIOPSY  W/ LOOP ELECTRODE EXCISION     CIN1  . COLPOSCOPY     VAIN1  . melanoma removal  05/2016   left arm  . ROTATOR CUFF REPAIR Right 2012  . TONSILLECTOMY    . TUBAL LIGATION     Family History  Problem Relation Age of Onset  . Cancer Mother        uterine & colon  . Hypertension Mother   . Cancer Sister        lung  . Hypertension Sister    Social History   Socioeconomic History  . Marital status: Divorced    Spouse  name: Not on file  . Number of children: Not on file  . Years of education: Not on file  . Highest education level: Not on file  Occupational History  . Not on file  Tobacco Use  . Smoking status: Never Smoker  . Smokeless tobacco: Never Used  Substance and Sexual Activity  . Alcohol use: Yes    Comment: 2 a week  . Drug use: No  . Sexual activity: Not Currently    Partners: Male    Birth control/protection: Surgical, Post-menopausal    Comment: BTL  Other Topics Concern  . Not on file  Social History Narrative  . Not on file   Social Determinants of Health   Financial Resource Strain: Low Risk   . Difficulty of Paying Living Expenses: Not hard at all  Food Insecurity: No Food Insecurity  .  Worried About Charity fundraiser in the Last Year: Never true  . Ran Out of Food in the Last Year: Never true  Transportation Needs: No Transportation Needs  . Lack of Transportation (Medical): No  . Lack of Transportation (Non-Medical): No  Physical Activity: Sufficiently Active  . Days of Exercise per Week: 4 days  . Minutes of Exercise per Session: 60 min  Stress: No Stress Concern Present  . Feeling of Stress : Not at all  Social Connections:   . Frequency of Communication with Friends and Family: Not on file  . Frequency of Social Gatherings with Friends and Family: Not on file  . Attends Religious Services: Not on file  . Active Member of Clubs or Organizations: Not on file  . Attends Archivist Meetings: Not on file  . Marital Status: Not on file    Tobacco Counseling Counseling given: Not Answered   Clinical Intake:  Pre-visit preparation completed: Yes  Pain : No/denies pain     Nutritional Risks: None Diabetes: No  How often do you need to have someone help you when you read instructions, pamphlets, or other written materials from your doctor or pharmacy?: 1 - Never What is the last grade level you completed in school?: some college  Diabetic: No Nutrition Risk Assessment:  Has the patient had any N/V/D within the last 2 months?  No  Does the patient have any non-healing wounds?  No  Has the patient had any unintentional weight loss or weight gain?  No   Diabetes:  Is the patient diabetic?  No  If diabetic, was a CBG obtained today?  N/A Did the patient bring in their glucometer from home?  N/A How often do you monitor your CBG's? N/A.   Financial Strains and Diabetes Management:  Are you having any financial strains with the device, your supplies or your medication? N/A.  Does the patient want to be seen by Chronic Care Management for management of their diabetes?  N/A Would the patient like to be referred to a Nutritionist or for Diabetic  Management?  N/A   Interpreter Needed?: No  Information entered by :: CJohnson, LPN   Activities of Daily Living In your present state of health, do you have any difficulty performing the following activities: 12/26/2019  Hearing? N  Vision? N  Difficulty concentrating or making decisions? N  Walking or climbing stairs? N  Dressing or bathing? N  Doing errands, shopping? N  Preparing Food and eating ? N  Using the Toilet? N  In the past six months, have you accidently leaked urine? N  Do you have  problems with loss of bowel control? N  Managing your Medications? N  Managing your Finances? N  Housekeeping or managing your Housekeeping? N  Some recent data might be hidden    Patient Care Team: Jinny Sanders, MD as PCP - General (Family Medicine)  Indicate any recent Medical Services you may have received from other than [facility] providers in the past year (date may be approximate).     Assessment:   This is a routine wellness examination for Kayleanna.  Hearing/Vision screen  Hearing Screening   125Hz  250Hz  500Hz  1000Hz  2000Hz  3000Hz  4000Hz  6000Hz  8000Hz   Right ear:           Left ear:           Vision Screening Comments: Patient states that she gets annual eye exams  Dietary issues and exercise activities discussed: Current Exercise Habits: Structured exercise class, Type of exercise: strength training/weights;walking, Time (Minutes): 60, Frequency (Times/Week): 4, Weekly Exercise (Minutes/Week): 240, Intensity: Moderate, Exercise limited by: None identified  Goals    . Patient Stated     12/26/2019, I will continue to go to the gym and take a spin class 3 days a week for 1 hour.       Depression Screen PHQ 2/9 Scores 12/26/2019 10/25/2018 10/22/2017 10/22/2017 04/22/2017  PHQ - 2 Score 0 0 2 0 1  PHQ- 9 Score 0 - 7 - 3    Fall Risk Fall Risk  12/26/2019 03/08/2019 10/25/2018  Falls in the past year? 0 0 0  Comment - Emmi Telephone Survey: data to providers prior to load -    Number falls in past yr: 0 - -  Injury with Fall? 0 - -  Risk for fall due to : No Fall Risks - -  Follow up Falls evaluation completed;Falls prevention discussed - -    Any stairs in or around the home? Yes  If so, are there any without handrails? No  Home free of loose throw rugs in walkways, pet beds, electrical cords, etc? Yes  Adequate lighting in your home to reduce risk of falls? Yes   ASSISTIVE DEVICES UTILIZED TO PREVENT FALLS:  Life alert? No  Use of a cane, walker or w/c? No  Grab bars in the bathroom? No  Shower chair or bench in shower? No  Elevated toilet seat or a handicapped toilet? No   TIMED UP AND GO:  Was the test performed? N/A, telephonic visit .    Cognitive Function: MMSE - Mini Mental State Exam 12/26/2019  Orientation to time 5  Orientation to Place 5  Registration 3  Attention/ Calculation 5  Recall 3  Language- repeat 1       Mini Cog  Mini-Cog screen was completed. Maximum score is 22. A value of 0 denotes this part of the MMSE was not completed or the patient failed this part of the Mini-Cog screening.  Immunizations Immunization History  Administered Date(s) Administered  . Fluad Quad(high Dose 65+) 12/28/2018  . Influenza,inj,Quad PF,6+ Mos 02/09/2017, 01/19/2018  . PFIZER SARS-COV-2 Vaccination 07/19/2019, 08/09/2019  . Pneumococcal Conjugate-13 10/25/2018  . Tdap 04/14/2007, 10/22/2017  . Zoster 09/20/2013    TDAP status: Up to date Flu Vaccine status: due, will get at upcoming office visit  Pneumococcal vaccine status: due, will get at upcoming office visit  Covid-19 vaccine status: Completed vaccines  Qualifies for Shingles Vaccine? Yes   Zostavax completed Yes   Shingrix Completed?: No.    Education has been provided regarding the  importance of this vaccine. Patient has been advised to call insurance company to determine out of pocket expense if they have not yet received this vaccine. Advised may also receive vaccine at  local pharmacy or Health Dept. Verbalized acceptance and understanding.  Screening Tests Health Maintenance  Topic Date Due  . PNA vac Low Risk Adult (2 of 2 - PPSV23) 10/25/2019  . INFLUENZA VACCINE  11/12/2019  . MAMMOGRAM  04/23/2021  . COLONOSCOPY  01/08/2023  . TETANUS/TDAP  10/23/2027  . DEXA SCAN  Completed  . COVID-19 Vaccine  Completed  . Hepatitis C Screening  Completed    Health Maintenance  Health Maintenance Due  Topic Date Due  . PNA vac Low Risk Adult (2 of 2 - PPSV23) 10/25/2019  . INFLUENZA VACCINE  11/12/2019    Colorectal cancer screening: Completed 01/07/2018. Repeat every 5 years Mammogram status: Completed 04/24/2019. Repeat every year Bone Density status: Completed 02/14/2015. Results reflect: Bone density results: NORMAL. Repeat every 2-5 years.  Lung Cancer Screening: (Low Dose CT Chest recommended if Age 63-80 years, 30 pack-year currently smoking OR have quit w/in 15 years.) does not qualify.    Additional Screening:  Hepatitis C Screening: does qualify; Completed 09/12/2015  Vision Screening: Recommended annual ophthalmology exams for early detection of glaucoma and other disorders of the eye. Is the patient up to date with their annual eye exam?  Yes  Who is the provider or what is the name of the office in which the patient attends annual eye exams? [facility], Dr. Valetta Close If pt is not established with a provider, would they like to be referred to a provider to establish care? No .   Dental Screening: Recommended annual dental exams for proper oral hygiene  Community Resource Referral / Chronic Care Management: CRR required this visit?  No   CCM required this visit?  No      Plan:     I have personally reviewed and noted the following in the patient's chart:   . Medical and social history . Use of alcohol, tobacco or illicit drugs  . Current medications and supplements . Functional ability and status . Nutritional  status . Physical activity . Advanced directives . List of other physicians . Hospitalizations, surgeries, and ER visits in previous 12 months . Vitals . Screenings to include cognitive, depression, and falls . Referrals and appointments  In addition, I have reviewed and discussed with patient certain preventive protocols, quality metrics, and best practice recommendations. A written personalized care plan for preventive services as well as general preventive health recommendations were provided to patient.   Due to this being a telephonic visit, the after visit summary with patients personalized plan was offered to patient via mail or my-chart.  Patient preferred to pick up at office at next visit.   Andrez Grime, LPN   3/71/0626

## 2019-12-26 NOTE — Patient Instructions (Signed)
[name] , Thank you for taking time to come for your Medicare Wellness Visit. I appreciate your ongoing commitment to your health goals. Please review the following plan we discussed and let me know if I can assist you in the future.   Screening recommendations/referrals: Colonoscopy: Up to date, completed 01/07/2018, due 12/2022 Mammogram: Up to date, completed 04/24/2019, due 04/2020 Bone Density: Up to date, completed 02/13/2015, due 2-5 years  Recommended yearly ophthalmology/optometry visit for glaucoma screening and checkup Recommended yearly dental visit for hygiene and checkup  Vaccinations: Influenza vaccine: due, will get at upcoming office visit  Pneumococcal vaccine: due, will get at upcoming office visit  Tdap vaccine: Up to date, completed 10/22/2017, due 10/2027 Shingles vaccine: due, check with your insurance regarding your insurance coverage    Covid-19:Completed series  Advanced directives: Please bring a copy of your POA (Power of [place]) and/or Living Will to your next appointment.   Conditions/risks identified: hypertension, hyperlipidemia  Next appointment: Follow up in one year for your annual wellness visit    Preventive Care 28 Years and Older, Female Preventive care refers to lifestyle choices and visits with your health care provider that can promote health and wellness. What does preventive care include?  A yearly physical exam. This is also called an annual well check.  Dental exams once or twice a year.  Routine eye exams. Ask your health care provider how often you should have your eyes checked.  Personal lifestyle choices, including:  Daily care of your teeth and gums.  Regular physical activity.  Eating a healthy diet.  Avoiding tobacco and drug use.  Limiting alcohol use.  Practicing safe sex.  Taking low-dose aspirin every day.  Taking vitamin and mineral supplements as recommended by your health care provider. What happens during an  annual well check? The services and screenings done by your health care provider during your annual well check will depend on your age, overall health, lifestyle risk factors, and family history of disease. Counseling  Your health care provider may ask you questions about your:  Alcohol use.  Tobacco use.  Drug use.  Emotional well-being.  Home and relationship well-being.  Sexual activity.  Eating habits.  History of falls.  Memory and ability to understand (cognition).  Work and work Statistician.  Reproductive health. Screening  You may have the following tests or measurements:  Height, weight, and BMI.  Blood pressure.  Lipid and cholesterol levels. These may be checked every 5 years, or more frequently if you are over 59 years old.  Skin check.  Lung cancer screening. You may have this screening every year starting at age 77 if you have a 30-pack-year history of smoking and currently smoke or have quit within the past 15 years.  Fecal occult blood test (FOBT) of the stool. You may have this test every year starting at age 30.  Flexible sigmoidoscopy or colonoscopy. You may have a sigmoidoscopy every 5 years or a colonoscopy every 10 years starting at age 4.  Hepatitis C blood test.  Hepatitis B blood test.  Sexually transmitted disease (STD) testing.  Diabetes screening. This is done by checking your blood sugar (glucose) after you have not eaten for a while (fasting). You may have this done every 1-3 years.  Bone density scan. This is done to screen for osteoporosis. You may have this done starting at age 1.  Mammogram. This may be done every 1-2 years. Talk to your health care provider about how often you  should have regular mammograms. Talk with your health care provider about your test results, treatment options, and if necessary, the need for more tests. Vaccines  Your health care provider may recommend certain vaccines, such as:  Influenza  vaccine. This is recommended every year.  Tetanus, diphtheria, and acellular pertussis (Tdap, Td) vaccine. You may need a Td booster every 10 years.  Zoster vaccine. You may need this after age 13.  Pneumococcal 13-valent conjugate (PCV13) vaccine. One dose is recommended after age 56.  Pneumococcal polysaccharide (PPSV23) vaccine. One dose is recommended after age 47. Talk to your health care provider about which screenings and vaccines you need and how often you need them. This information is not intended to replace advice given to you by your health care provider. Make sure you discuss any questions you have with your health care provider. Document Released: 04/26/2015 Document Revised: 12/18/2015 Document Reviewed: 01/29/2015 Elsevier Interactive Patient Education  2017 [place] Prevention in the Home Falls can cause injuries. They can happen to people of all ages. There are many things you can do to make your home safe and to help prevent falls. What can I do on the outside of my home?  Regularly fix the edges of walkways and driveways and fix any cracks.  Remove anything that might make you trip as you walk through a door, such as a raised step or threshold.  Trim any bushes or trees on the path to your home.  Use bright outdoor lighting.  Clear any walking paths of anything that might make someone trip, such as rocks or tools.  Regularly check to see if handrails are loose or broken. Make sure that both sides of any steps have handrails.  Any raised decks and porches should have guardrails on the edges.  Have any leaves, snow, or ice cleared regularly.  Use sand or salt on walking paths during winter.  Clean up any spills in your garage right away. This includes oil or grease spills. What can I do in the bathroom?  Use night lights.  Install grab bars by the toilet and in the tub and shower. Do not use towel bars as grab bars.  Use non-skid mats or decals  in the tub or shower.  If you need to sit down in the shower, use a plastic, non-slip stool.  Keep the floor dry. Clean up any water that spills on the floor as soon as it happens.  Remove soap buildup in the tub or shower regularly.  Attach bath mats securely with double-sided non-slip rug tape.  Do not have throw rugs and other things on the floor that can make you trip. What can I do in the bedroom?  Use night lights.  Make sure that you have a light by your bed that is easy to reach.  Do not use any sheets or blankets that are too big for your bed. They should not hang down onto the floor.  Have a firm chair that has side arms. You can use this for support while you get dressed.  Do not have throw rugs and other things on the floor that can make you trip. What can I do in the kitchen?  Clean up any spills right away.  Avoid walking on wet floors.  Keep items that you use a lot in easy-to-reach places.  If you need to reach something above you, use a strong step stool that has a grab bar.  Keep electrical cords out  of the way.  Do not use floor polish or wax that makes floors slippery. If you must use wax, use non-skid floor wax.  Do not have throw rugs and other things on the floor that can make you trip. What can I do with my stairs?  Do not leave any items on the stairs.  Make sure that there are handrails on both sides of the stairs and use them. Fix handrails that are broken or loose. Make sure that handrails are as long as the stairways.  Check any carpeting to make sure that it is firmly attached to the stairs. Fix any carpet that is loose or worn.  Avoid having throw rugs at the top or bottom of the stairs. If you do have throw rugs, attach them to the floor with carpet tape.  Make sure that you have a light switch at the top of the stairs and the bottom of the stairs. If you do not have them, ask someone to add them for you. What else can I do to help  prevent falls?  Wear shoes that:  Do not have high heels.  Have rubber bottoms.  Are comfortable and fit you well.  Are closed at the toe. Do not wear sandals.  If you use a stepladder:  Make sure that it is fully opened. Do not climb a closed stepladder.  Make sure that both sides of the stepladder are locked into place.  Ask someone to hold it for you, if possible.  Clearly mark and make sure that you can see:  Any grab bars or handrails.  First and last steps.  Where the edge of each step is.  Use tools that help you move around (mobility aids) if they are needed. These include:  Canes.  Walkers.  Scooters.  Crutches.  Turn on the lights when you go into a dark area. Replace any light bulbs as soon as they burn out.  Set up your furniture so you have a clear path. Avoid moving your furniture around.  If any of your floors are uneven, fix them.  If there are any pets around you, be aware of where they are.  Review your medicines with your doctor. Some medicines can make you feel dizzy. This can increase your chance of falling. Ask your doctor what other things that you can do to help prevent falls. This information is not intended to replace advice given to you by your health care provider. Make sure you discuss any questions you have with your health care provider. Document Released: 01/24/2009 Document Revised: 09/05/2015 Document Reviewed: 05/04/2014 Elsevier Interactive Patient Education  2017 [employer].

## 2019-12-28 ENCOUNTER — Encounter: Payer: Self-pay | Admitting: Family Medicine

## 2019-12-28 ENCOUNTER — Ambulatory Visit (INDEPENDENT_AMBULATORY_CARE_PROVIDER_SITE_OTHER): Payer: PPO | Admitting: Family Medicine

## 2019-12-28 ENCOUNTER — Other Ambulatory Visit: Payer: Self-pay

## 2019-12-28 VITALS — BP 138/80 | HR 58 | Temp 98.5°F | Ht 62.5 in | Wt 178.8 lb

## 2019-12-28 DIAGNOSIS — Z0000 Encounter for general adult medical examination without abnormal findings: Secondary | ICD-10-CM

## 2019-12-28 DIAGNOSIS — I10 Essential (primary) hypertension: Secondary | ICD-10-CM

## 2019-12-28 DIAGNOSIS — F411 Generalized anxiety disorder: Secondary | ICD-10-CM

## 2019-12-28 DIAGNOSIS — Z23 Encounter for immunization: Secondary | ICD-10-CM | POA: Diagnosis not present

## 2019-12-28 DIAGNOSIS — E7800 Pure hypercholesterolemia, unspecified: Secondary | ICD-10-CM | POA: Diagnosis not present

## 2019-12-28 MED ORDER — SHINGRIX 50 MCG/0.5ML IM SUSR: 0.5000 mL | Freq: Once | INTRAMUSCULAR | 1 refills | Status: AC

## 2019-12-28 NOTE — Progress Notes (Signed)
Chief Complaint  Patient presents with  . Annual Exam    Part 2    History of Present Illness: HPI  The patient presents for complete physical and review of chronic health problems. He/She also has the following acute concerns today: none  The patient saw a LPN or RN for medicare wellness visit.  Prevention and wellness was reviewed in detail. Note reviewed and important notes copied below.  Health Maintenance: Pneumococcal 23- due Flu- due Shingrix- due Abnormal Screenings: none   12/28/19 Hypertension:    Well controlled on  Losartan... She has not been using HCTZ unless swelling. BP Readings from Last 3 Encounters:  12/28/19 138/80  10/25/18 124/72  09/28/18 120/82  Using medication without problems or lightheadedness:  none Chest pain with exertion:none Edema:none Short of breath:none Average home BPs: Other issues: Wt Readings from Last 3 Encounters:  12/28/19 178 lb 12 oz (81.1 kg)  12/26/19 181 lb (82.1 kg)  10/25/18 192 lb 4 oz (87.2 kg)     Elevated Cholesterol: LDL at goal on atorvastatin. Lab Results  Component Value Date   CHOL 179 12/25/2019   HDL 66.50 12/25/2019   LDLCALC 92 12/25/2019   TRIG 105.0 12/25/2019   CHOLHDL 3 12/25/2019  Using medications without problems:none Muscle aches: none Diet compliance: good Exercise: hiking, walking Other complaints:   GAD:  Well controlled since retiring.  This visit occurred during the SARS-CoV-2 public health emergency.  Safety protocols were in place, including screening questions prior to the visit, additional usage of staff PPE, and extensive cleaning of exam room while observing appropriate contact time as indicated for disinfecting solutions.   COVID 19 screen:  No recent travel or known exposure to COVID19 The patient denies respiratory symptoms of COVID 19 at this time. The importance of social distancing was discussed today.     Review of Systems  Constitutional: Negative for chills  and fever.  HENT: Negative for congestion and ear pain.   Eyes: Negative for pain and redness.  Respiratory: Negative for cough and shortness of breath.   Cardiovascular: Positive for palpitations. Negative for chest pain and leg swelling.       HAD once when in [place]. None since being home.  Gastrointestinal: Negative for abdominal pain, blood in stool, constipation, diarrhea, nausea and vomiting.  Genitourinary: Negative for dysuria.  Musculoskeletal: Negative for falls and myalgias.  Skin: Negative for rash.  Neurological: Negative for dizziness.  Psychiatric/Behavioral: Negative for depression. The patient is not nervous/anxious.       Past Medical History:  Diagnosis Date  . Abnormal Pap smear of cervix   . Cancer ([place])    melanoma lower left leg, left arm  . Difficult intubation    took 3 tries with nerve block  . Fibroid    5 cm  . Hyperlipidemia   . Hypertension   . IBS (irritable bowel syndrome)   . Shingles     reports that she has never smoked. She has never used smokeless tobacco. She reports current alcohol use. She reports that she does not use drugs.   Current Outpatient Medications:  .  atorvastatin (LIPITOR) 10 MG tablet, TAKE 1 TABLET BY MOUTH EVERY DAY, Disp: 90 tablet, Rfl: 3 .  Coenzyme Q10 (CO Q 10 PO), Take 1 tablet by mouth. 4 times a week, Disp: , Rfl:  .  Cyanocobalamin (VITAMIN B-12 PO), Take 1 tablet by mouth daily. , Disp: , Rfl:  .  fluticasone (FLONASE) 50 MCG/ACT nasal spray, Place  2 sprays into both nostrils daily as needed. , Disp: , Rfl:  .  hydrochlorothiazide (MICROZIDE) 12.5 MG capsule, TAKE 1 CAPSULE (12.5 MG TOTAL) BY MOUTH EVERY OTHER DAY., Disp: 45 capsule, Rfl: 0 .  Ibuprofen (ADVIL PO), Take by mouth as needed., Disp: , Rfl:  .  losartan (COZAAR) 100 MG tablet, TAKE 1/2 TABLET BY MOUTH EVERY DAY, Disp: 90 tablet, Rfl: 0 .  montelukast (SINGULAIR) 10 MG tablet, TAKE 1 TABLET BY MOUTH EVERY DAY AS NEEDED, Disp: 180 tablet, Rfl: 0 .   Multiple Vitamins-Minerals (MULTIVITAMIN PO), Take by mouth daily., Disp: , Rfl:  .  valACYclovir (VALTREX) 1000 MG tablet, Take one for 3 days at onset of outbreak, Disp: 90 tablet, Rfl: 1 .  VITAMIN D, ERGOCALCIFEROL, PO, Take 1,000 Int'l Units by mouth daily. , Disp: , Rfl:  .  Zoster Vaccine Adjuvanted [facility]) injection, Inject 0.5 mLs into the muscle once for 1 dose., Disp: 0.5 mL, Rfl: 1   Observations/Objective: Blood pressure 138/80, pulse (!) 58, temperature 98.5 F (36.9 C), temperature source Temporal, height 5' 2.5" (1.588 m), weight 178 lb 12 oz (81.1 kg), last menstrual period 04/13/2004, SpO2 96 %.  Physical Exam Constitutional:      General: She is not in acute distress.    Appearance: Normal appearance. She is well-developed. She is not ill-appearing or toxic-appearing.  HENT:     Head: Normocephalic.     Right Ear: Hearing, tympanic membrane, ear canal and external ear normal.     Left Ear: Hearing, tympanic membrane, ear canal and external ear normal.     Nose: Nose normal.  Eyes:     General: Lids are normal. Lids are everted, no foreign bodies appreciated.     Conjunctiva/sclera: Conjunctivae normal.     Pupils: Pupils are equal, round, and reactive to light.  Neck:     Thyroid: No thyroid mass or thyromegaly.     Vascular: No carotid bruit.     Trachea: Trachea normal.  Cardiovascular:     Rate and Rhythm: Normal rate and regular rhythm.     Heart sounds: Normal heart sounds, S1 normal and S2 normal. No murmur heard.  No gallop.   Pulmonary:     Effort: Pulmonary effort is normal. No respiratory distress.     Breath sounds: Normal breath sounds. No wheezing, rhonchi or rales.  Abdominal:     General: Bowel sounds are normal. There is no distension or abdominal bruit.     Palpations: Abdomen is soft. There is no fluid wave or mass.     Tenderness: There is no abdominal tenderness. There is no guarding or rebound.     Hernia: No hernia is present.   Musculoskeletal:     Cervical back: Normal range of motion and neck supple.  Lymphadenopathy:     Cervical: No cervical adenopathy.  Skin:    General: Skin is warm and dry.     Findings: Lesion present. No rash.     Comments: 2 dry patchy skin lesions.. md back and left elbow.. has appt with Derm upcoming. Use OTC topical CORTISONE BID.  Neurological:     Mental Status: She is alert.     Cranial Nerves: No cranial nerve deficit.     Sensory: No sensory deficit.  Psychiatric:        Mood and Affect: Mood is not anxious or depressed.        Speech: Speech normal.        Behavior: Behavior  normal. Behavior is cooperative.        Judgment: Judgment normal.      Assessment and Plan   The patient's preventative maintenance and recommended screening tests for an annual wellness exam were reviewed in full today. Brought up to date unless services declined.  Counselled on the importance of diet, exercise, and its role in overall health and mortality. The patient's FH and SH was reviewed, including their home life, tobacco status, and drug and alcohol status.   Vaccines:Uptodate tdap, had shingles vaccine in 2015, due for PNA23 and high dose flu  COVID vaccine series completed. Given rx for shingles. Pap/DVE:Per GYN, 09/28/2018 Mammo:per GYN 02/2020, yearly Bone Density:2016 normal.repeat in 5 years. Colon:Mother colon cancer repeat q 5 years , [place].. 01/07/2018 Smoking Status:none ETOH/ drug use: couple times a month/ none Hep C:neg HIV screen:neg  Hypertension Well controlled. Continue current medication.   Hyperlipidemia  LDL at goal on atorvastatin.  GAD (generalized anxiety disorder) Well controlled since retiring.     Eliezer Lofts, MD

## 2019-12-28 NOTE — Assessment & Plan Note (Signed)
Well controlled since retiring.

## 2019-12-28 NOTE — Assessment & Plan Note (Signed)
LDL at goal on atorvastatin.  

## 2019-12-28 NOTE — Patient Instructions (Addendum)
Follow BP at home off HCTZ... goal < 140/90.  Call if it is above that consistently. Work on [employer], exercise and weight loss.    Preventive Care 66 Years and Older, Female Preventive care refers to lifestyle choices and visits with your health care provider that can promote health and wellness. This includes:  A yearly physical exam. This is also called an annual well check.  Regular dental and eye exams.  Immunizations.  Screening for certain conditions.  Healthy lifestyle choices, such as diet and exercise. What can I expect for my preventive care visit? Physical exam Your health care provider will check:  Height and weight. These may be used to calculate body mass index (BMI), which is a measurement that tells if you are at a healthy weight.  Heart rate and blood pressure.  Your skin for abnormal spots. Counseling Your health care provider may ask you questions about:  Alcohol, tobacco, and drug use.  Emotional well-being.  Home and relationship well-being.  Sexual activity.  Eating habits.  History of falls.  Memory and ability to understand (cognition).  Work and work Statistician.  Pregnancy and menstrual history. What immunizations do I need?  Influenza (flu) vaccine  This is recommended every year. Tetanus, diphtheria, and pertussis (Tdap) vaccine  You may need a Td booster every 10 years. Varicella (chickenpox) vaccine  You may need this vaccine if you have not already been vaccinated. Zoster (shingles) vaccine  You may need this after age 50. Pneumococcal conjugate (PCV13) vaccine  One dose is recommended after age 66. Pneumococcal polysaccharide (PPSV23) vaccine  One dose is recommended after age 66 Measles, mumps, and rubella (MMR) vaccine  You may need at least one dose of MMR if you were born in 1957 or later. You may also need a second dose. Meningococcal conjugate (MenACWY) vaccine  You may need this if you have certain  conditions. Hepatitis A vaccine  You may need this if you have certain conditions or if you travel or work in places where you may be exposed to hepatitis A. Hepatitis B vaccine  You may need this if you have certain conditions or if you travel or work in places where you may be exposed to hepatitis B. Haemophilus influenzae type b (Hib) vaccine  You may need this if you have certain conditions. You may receive vaccines as individual doses or as more than one vaccine together in one shot (combination vaccines). Talk with your health care provider about the risks and benefits of combination vaccines. What tests do I need? Blood tests  Lipid and cholesterol levels. These may be checked every 5 years, or more frequently depending on your overall health.  Hepatitis C test.  Hepatitis B test. Screening  Lung cancer screening. You may have this screening every year starting at age 66 if you have a 30-pack-year history of smoking and currently smoke or have quit within the past 15 years.  Colorectal cancer screening. All adults should have this screening starting at age 66 and continuing until age 76. Your health care provider may recommend screening at age 66 if you are at increased risk. You will have tests every 1-10 years, depending on your results and the type of screening test.  Diabetes screening. This is done by checking your blood sugar (glucose) after you have not eaten for a while (fasting). You may have this done every 1-3 years.  Mammogram. This may be done every 1-2 years. Talk with your health care provider about  how often you should have regular mammograms.  BRCA-related cancer screening. This may be done if you have a family history of breast, ovarian, tubal, or peritoneal cancers. Other tests  Sexually transmitted disease (STD) testing.  Bone density scan. This is done to screen for osteoporosis. You may have this done starting at age 66 Follow these instructions at  home: Eating and drinking  Eat a diet that includes fresh fruits and vegetables, whole grains, lean protein, and low-fat dairy products. Limit your intake of foods with high amounts of sugar, saturated fats, and salt.  Take vitamin and mineral supplements as recommended by your health care provider.  Do not drink alcohol if your health care provider tells you not to drink.  If you drink alcohol: ? Limit how much you have to 0-1 drink a day. ? Be aware of how much alcohol is in your drink. In the [place] one drink equals one 12 oz bottle of beer (355 mL), one 5 oz glass of wine (148 mL), or one 1 oz glass of hard liquor (44 mL). Lifestyle  Take daily care of your teeth and gums.  Stay active. Exercise for at least 30 minutes on 5 or more days each week.  Do not use any products that contain nicotine or tobacco, such as cigarettes, e-cigarettes, and chewing tobacco. If you need help quitting, ask your health care provider.  If you are sexually active, practice safe sex. Use a condom or other form of protection in order to prevent STIs (sexually transmitted infections).  Talk with your health care provider about taking a low-dose aspirin or statin. What's next?  Go to your health care provider once a year for a well check visit.  Ask your health care provider how often you should have your eyes and teeth checked.  Stay up to date on all vaccines. This information is not intended to replace advice given to you by your health care provider. Make sure you discuss any questions you have with your health care provider. Document Revised: 03/24/2018 Document Reviewed: 03/24/2018 Elsevier Patient Education  2020 [employer].

## 2019-12-28 NOTE — Assessment & Plan Note (Signed)
Well controlled. Continue current medication.  

## 2019-12-29 ENCOUNTER — Other Ambulatory Visit: Payer: Self-pay | Admitting: Family Medicine

## 2020-01-01 ENCOUNTER — Telehealth: Payer: Self-pay | Admitting: *Deleted

## 2020-01-01 NOTE — Telephone Encounter (Signed)
Noted SE to flu and pneumonia vaccines.

## 2020-01-01 NOTE — Telephone Encounter (Signed)
Patient left a voicemail stating that she was seen Thursday 12/28/19 and was given a flu and pneumonia vaccine. Patient stated that she has felt bad for 3 days. Patient stated that she has had a fever of 100.3, headache and stomach has felt  bad. Patient stated that she has been taking tylenol every 8 hours for her symptoms. Patient stated that she is feeling better today. Patient stated that she wanted to let Dr. Diona Browner know the side effects that she had from the vaccines and feels that this needs to be noted on her chart.

## 2020-01-04 ENCOUNTER — Other Ambulatory Visit: Payer: Self-pay

## 2020-01-04 ENCOUNTER — Ambulatory Visit (INDEPENDENT_AMBULATORY_CARE_PROVIDER_SITE_OTHER): Payer: PPO | Admitting: Obstetrics and Gynecology

## 2020-01-04 ENCOUNTER — Encounter: Payer: Self-pay | Admitting: Obstetrics and Gynecology

## 2020-01-04 VITALS — BP 138/74 | HR 83 | Ht 62.5 in | Wt 182.2 lb

## 2020-01-04 DIAGNOSIS — Z01419 Encounter for gynecological examination (general) (routine) without abnormal findings: Secondary | ICD-10-CM

## 2020-01-04 DIAGNOSIS — Z124 Encounter for screening for malignant neoplasm of cervix: Secondary | ICD-10-CM

## 2020-01-04 NOTE — Progress Notes (Signed)
64 y.o. G1P0010 Divorced White or Caucasian Not Hispanic or Latino female here for annual exam.  No vaginal bleeding.  Divorced, not dating, hasn't met anyone.   She has HSV on her buttocks, hasn't had an outbreak in a year.   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 Had so much fun.     Patient's last menstrual period was 04/13/2004.          Sexually active: No.  The current method of family planning is post menopausal status.    Exercising: Yes.    Gym/ health club routine includes cardio and stationary bike. Smoker:  no  Health Maintenance: Pap:  09/21/17 Neg HPV Hr Neg  History of abnormal Pap:  Yes had a LEEP CIN I and VAIN I (at least 10 years ago) MMG:  04/24/19 Density B Bi-rads 1 neg  BMD:   2016 f/u 5 years  Colonoscopy: 2019 f/u 5 years  TDaP:  2019 Gardasil: na   reports that she has never smoked. She has never used smokeless tobacco. She reports current alcohol use. She reports that she does not use drugs. Just occasional ETOH. Retired Radio broadcast assistant.   Past Medical History:  Diagnosis Date  . Abnormal Pap smear of cervix   . Cancer ([place])    melanoma lower left leg, left arm  . Difficult intubation    took 3 tries with nerve block  . Fibroid    5 cm  . Hyperlipidemia   . Hypertension   . IBS (irritable bowel syndrome)   . Shingles     Past Surgical History:  Procedure Laterality Date  . CARPAL TUNNEL RELEASE  2004   left & right  . CERVICAL BIOPSY  W/ LOOP ELECTRODE EXCISION     CIN1  . COLPOSCOPY     VAIN1  . melanoma removal  05/2016   left arm  . ROTATOR CUFF REPAIR Right 2012  . TONSILLECTOMY    . TUBAL LIGATION      Current Outpatient Medications  Medication Sig Dispense Refill  . atorvastatin (LIPITOR) 10 MG tablet TAKE 1 TABLET BY MOUTH EVERY DAY 90 tablet 3  . Coenzyme Q10 (CO Q 10 PO) Take 1 tablet by mouth. 4 times a week    . Cyanocobalamin (VITAMIN B-12 PO) Take 1 tablet by mouth daily.     . fluticasone (FLONASE) 50 MCG/ACT nasal  spray Place 2 sprays into both nostrils daily as needed.     . hydrochlorothiazide (MICROZIDE) 12.5 MG capsule TAKE 1 CAPSULE (12.5 MG TOTAL) BY MOUTH EVERY OTHER DAY. 45 capsule 0  . Ibuprofen (ADVIL PO) Take by mouth as needed.    Marland Kitchen losartan (COZAAR) 100 MG tablet TAKE 1/2 TABLET BY MOUTH EVERY DAY 90 tablet 0  . montelukast (SINGULAIR) 10 MG tablet TAKE 1 TABLET BY MOUTH EVERY DAY AS NEEDED 180 tablet 0  . Multiple Vitamins-Minerals (MULTIVITAMIN PO) Take by mouth daily.    . valACYclovir (VALTREX) 1000 MG tablet Take one for 3 days at onset of outbreak 90 tablet 1  . VITAMIN D, ERGOCALCIFEROL, PO Take 1,000 Int'l Units by mouth daily.      No current facility-administered medications for this visit.    Family History  Problem Relation Age of Onset  . Cancer Mother        uterine & colon  . Hypertension Mother   . Cancer Sister        lung  . Hypertension Sister  Review of Systems  All other systems reviewed and are negative.   Exam:   LMP 04/13/2004   Weight change: _0 @ Height:      Ht Readings from Last 3 Encounters:  12/28/19 5' 2.5" (1.588 m)  10/25/18 5' 2.75" (1.594 m)  09/28/18 5' 2.75" (1.594 m)    General appearance: alert, cooperative and appears stated age Head: Normocephalic, without obvious abnormality, atraumatic Neck: no adenopathy, supple, symmetrical, trachea midline and thyroid normal to inspection and palpation Lungs: clear to auscultation bilaterally Cardiovascular: regular rate and rhythm Breasts: normal appearance, no masses or tenderness Abdomen: soft, non-tender; non distended,  no masses,  no organomegaly Extremities: extremities normal, atraumatic, no cyanosis or edema Skin: Skin color, texture, turgor normal. No rashes or lesions Lymph nodes: Cervical, supraclavicular, and axillary nodes normal. No abnormal inguinal nodes palpated Neurologic: Grossly normal   Pelvic: External genitalia:  no lesions              Urethra:   normal appearing urethra with no masses, tenderness or lesions              Bartholins and Skenes: normal                 Vagina: atrophic appearing vagina with normal color and discharge, no lesions              Cervix: no lesions               Bimanual Exam:  Uterus:  normal size, contour, position, consistency, mobility, non-tender              Adnexa: no mass, fullness, tenderness               Rectovaginal: Confirms               Anus:  normal sphincter tone, no lesions  Gae Dry chaperoned for the exam.  A:  Normal gyn exam  Normal DEXA in 11/16  P:   Pap with hpv (h/O leep)  F/U in 2 years  Discussed breast self exam  Discussed calcium and vit D intake  Mammogram in 1/22  Colonoscopy UTD  DEXA in 11/26

## 2020-01-04 NOTE — Patient Instructions (Signed)
EXERCISE AND DIET:  We recommended that you start or continue a regular exercise program for good health. Regular exercise means any activity that makes your heart beat faster and makes you sweat.  We recommend exercising at least 30 minutes per day at least 3 days a week, preferably 4 or 5.  We also recommend a diet low in fat and sugar.  Inactivity, poor dietary choices and obesity can cause diabetes, heart attack, stroke, and kidney damage, among others.    ALCOHOL AND SMOKING:  Women should limit their alcohol intake to no more than 7 drinks/beers/glasses of wine (combined, not each!) per week. Moderation of alcohol intake to this level decreases your risk of breast cancer and liver damage. And of course, no recreational drugs are part of a healthy lifestyle.  And absolutely no smoking or even second hand smoke. Most people know smoking can cause heart and lung diseases, but did you know it also contributes to weakening of your bones? Aging of your skin?  Yellowing of your teeth and nails?  CALCIUM AND VITAMIN D:  Adequate intake of calcium and Vitamin D are recommended.  The recommendations for exact amounts of these supplements seem to change often, but generally speaking 1,200 mg of calcium (between diet and supplement) and 800 units of Vitamin D per day seems prudent. Certain women may benefit from higher intake of Vitamin D.  If you are among these women, your doctor will have told you during your visit.    PAP SMEARS:  Pap smears, to check for cervical cancer or precancers,  have traditionally been done yearly, although recent scientific advances have shown that most women can have pap smears less often.  However, every woman still should have a physical exam from her gynecologist every year. It will include a breast check, inspection of the vulva and vagina to check for abnormal growths or skin changes, a visual exam of the cervix, and then an exam to evaluate the size and shape of the uterus and  ovaries.  And after 66 years of age, a rectal exam is indicated to check for rectal cancers. We will also provide age appropriate advice regarding health maintenance, like when you should have certain vaccines, screening for sexually transmitted diseases, bone density testing, colonoscopy, mammograms, etc.   MAMMOGRAMS:  All women over 40 years old should have a yearly mammogram. Many facilities now offer a "3D" mammogram, which may cost around $50 extra out of pocket. If possible,  we recommend you accept the option to have the 3D mammogram performed.  It both reduces the number of women who will be called back for extra views which then turn out to be normal, and it is better than the routine mammogram at detecting truly abnormal areas.    COLON CANCER SCREENING: Now recommend starting at age 45. At this time colonoscopy is not covered for routine screening until 50. There are take home tests that can be done between 45-49.   COLONOSCOPY:  Colonoscopy to screen for colon cancer is recommended for all women at age 50.  We know, you hate the idea of the prep.  We agree, BUT, having colon cancer and not knowing it is worse!!  Colon cancer so often starts as a polyp that can be seen and removed at colonscopy, which can quite literally save your life!  And if your first colonoscopy is normal and you have no family history of colon cancer, most women don't have to have it again for   10 years.  Once every ten years, you can do something that may end up saving your life, right?  We will be happy to help you get it scheduled when you are ready.  Be sure to check your insurance coverage so you understand how much it will cost.  It may be covered as a preventative service at no cost, but you should check your particular policy.      Breast Self-Awareness Breast self-awareness means being familiar with how your breasts look and feel. It involves checking your breasts regularly and reporting any changes to your  health care provider. Practicing breast self-awareness is important. A change in your breasts can be a sign of a serious medical problem. Being familiar with how your breasts look and feel allows you to find any problems early, when treatment is more likely to be successful. All women should practice breast self-awareness, including women who have had breast implants. How to do a breast self-exam One way to learn what is normal for your breasts and whether your breasts are changing is to do a breast self-exam. To do a breast self-exam: Look for Changes  1. Remove all the clothing above your waist. 2. Stand in front of a mirror in a room with good lighting. 3. Put your hands on your hips. 4. Push your hands firmly downward. 5. Compare your breasts in the mirror. Look for differences between them (asymmetry), such as: ? Differences in shape. ? Differences in size. ? Puckers, dips, and bumps in one breast and not the other. 6. Look at each breast for changes in your skin, such as: ? Redness. ? Scaly areas. 7. Look for changes in your nipples, such as: ? Discharge. ? Bleeding. ? Dimpling. ? Redness. ? A change in position. Feel for Changes Carefully feel your breasts for lumps and changes. It is best to do this while lying on your back on the floor and again while sitting or standing in the shower or tub with soapy water on your skin. Feel each breast in the following way:  Place the arm on the side of the breast you are examining above your head.  Feel your breast with the other hand.  Start in the nipple area and make  inch (2 cm) overlapping circles to feel your breast. Use the pads of your three middle fingers to do this. Apply light pressure, then medium pressure, then firm pressure. The light pressure will allow you to feel the tissue closest to the skin. The medium pressure will allow you to feel the tissue that is a little deeper. The firm pressure will allow you to feel the tissue  close to the ribs.  Continue the overlapping circles, moving downward over the breast until you feel your ribs below your breast.  Move one finger-width toward the center of the body. Continue to use the  inch (2 cm) overlapping circles to feel your breast as you move slowly up toward your collarbone.  Continue the up and down exam using all three pressures until you reach your armpit.  Write Down What You Find  Write down what is normal for each breast and any changes that you find. Keep a written record with breast changes or normal findings for each breast. By writing this information down, you do not need to depend only on memory for size, tenderness, or location. Write down where you are in your menstrual cycle, if you are still menstruating. If you are having trouble noticing differences   in your breasts, do not get discouraged. With time you will become more familiar with the variations in your breasts and more comfortable with the exam. How often should I examine my breasts? Examine your breasts every month. If you are breastfeeding, the best time to examine your breasts is after a feeding or after using a breast pump. If you menstruate, the best time to examine your breasts is 5-7 days after your period is over. During your period, your breasts are lumpier, and it may be more difficult to notice changes. When should I see my health care provider? See your health care provider if you notice:  A change in shape or size of your breasts or nipples.  A change in the skin of your breast or nipples, such as a reddened or scaly area.  Unusual discharge from your nipples.  A lump or thick area that was not there before.  Pain in your breasts.  Anything that concerns you.  

## 2020-01-05 ENCOUNTER — Other Ambulatory Visit (HOSPITAL_COMMUNITY)
Admission: RE | Admit: 2020-01-05 | Discharge: 2020-01-05 | Disposition: A | Discharge status: Home / Self Care | Payer: PPO | Source: Ambulatory Visit | Attending: Obstetrics and Gynecology | Admitting: Obstetrics and Gynecology

## 2020-01-05 DIAGNOSIS — Z1151 Encounter for screening for human papillomavirus (HPV): Secondary | ICD-10-CM | POA: Insufficient documentation

## 2020-01-05 DIAGNOSIS — Z124 Encounter for screening for malignant neoplasm of cervix: Secondary | ICD-10-CM | POA: Insufficient documentation

## 2020-01-05 NOTE — Addendum Note (Signed)
Addended by: Dorothy Spark on: 01/05/2020 04:42 PM   Modules accepted: Orders

## 2020-01-08 LAB — CYTOLOGY - PAP
Comment: NEGATIVE
Diagnosis: NEGATIVE
High risk HPV: NEGATIVE

## 2020-01-16 ENCOUNTER — Ambulatory Visit (INDEPENDENT_AMBULATORY_CARE_PROVIDER_SITE_OTHER): Payer: PPO | Admitting: Family Medicine

## 2020-01-16 ENCOUNTER — Other Ambulatory Visit: Payer: Self-pay

## 2020-01-16 ENCOUNTER — Telehealth: Payer: Self-pay | Admitting: *Deleted

## 2020-01-16 ENCOUNTER — Encounter: Payer: Self-pay | Admitting: Family Medicine

## 2020-01-16 DIAGNOSIS — I10 Essential (primary) hypertension: Secondary | ICD-10-CM

## 2020-01-16 DIAGNOSIS — S46819A Strain of other muscles, fascia and tendons at shoulder and upper arm level, unspecified arm, initial encounter: Secondary | ICD-10-CM | POA: Diagnosis not present

## 2020-01-16 MED ORDER — CYCLOBENZAPRINE HCL 5 MG PO TABS: 5.0000 mg | ORAL_TABLET | Freq: Every day | ORAL | 0 refills | Status: DC

## 2020-01-16 NOTE — Patient Instructions (Addendum)
Follow BP at home.. call if not improving with pain and stress. Heat on neck. Ibuprofen 600 mg three times daily as needed for pain. Start home stretches, massage. Can use muscle relaxant at night. Call if not improving in 2 weeks.

## 2020-01-16 NOTE — Telephone Encounter (Signed)
Received fax from [facility] requesting PA for cyclobenzaprine 5 mg.  PA completed on CoverMyMeds and sent for review.  Can take up to 72 hours for a decision.

## 2020-01-16 NOTE — Assessment & Plan Note (Signed)
No indication for imaging.  no red flags. Heat on neck. Ibuprofen 600 mg three times daily as needed for pain. Start home stretches, massage. Can use muscle relaxant at night. Call if not improving in 2 weeks.

## 2020-01-16 NOTE — Telephone Encounter (Signed)
PA approved.  [facility] notified of approval via fax. 

## 2020-01-16 NOTE — Assessment & Plan Note (Signed)
Possibly due to pain, anxiety... previously well controlled... follow at home, call if elevated.
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
## 2020-01-17 DIAGNOSIS — D225 Melanocytic nevi of trunk: Secondary | ICD-10-CM | POA: Diagnosis not present

## 2020-01-17 DIAGNOSIS — L929 Granulomatous disorder of the skin and subcutaneous tissue, unspecified: Secondary | ICD-10-CM | POA: Diagnosis not present

## 2020-01-17 DIAGNOSIS — D1801 Hemangioma of skin and subcutaneous tissue: Secondary | ICD-10-CM | POA: Diagnosis not present

## 2020-01-17 DIAGNOSIS — D229 Melanocytic nevi, unspecified: Secondary | ICD-10-CM | POA: Diagnosis not present

## 2020-01-17 DIAGNOSIS — D485 Neoplasm of uncertain behavior of skin: Secondary | ICD-10-CM | POA: Diagnosis not present

## 2020-01-17 DIAGNOSIS — L821 Other seborrheic keratosis: Secondary | ICD-10-CM | POA: Diagnosis not present

## 2020-01-17 DIAGNOSIS — L814 Other melanin hyperpigmentation: Secondary | ICD-10-CM | POA: Diagnosis not present

## 2020-01-22 ENCOUNTER — Ambulatory Visit: Payer: PPO | Admitting: Certified Nurse Midwife

## 2020-02-12 DIAGNOSIS — H5202 Hypermetropia, left eye: Secondary | ICD-10-CM | POA: Diagnosis not present

## 2020-02-12 DIAGNOSIS — H5211 Myopia, right eye: Secondary | ICD-10-CM | POA: Diagnosis not present

## 2020-02-12 DIAGNOSIS — H11002 Unspecified pterygium of left eye: Secondary | ICD-10-CM | POA: Diagnosis not present

## 2020-03-27 ENCOUNTER — Other Ambulatory Visit: Payer: Self-pay | Admitting: Family Medicine

## 2020-03-27 DIAGNOSIS — H11062 Recurrent pterygium of left eye: Secondary | ICD-10-CM | POA: Diagnosis not present

## 2020-03-29 ENCOUNTER — Other Ambulatory Visit: Payer: Self-pay | Admitting: Family Medicine

## 2020-03-29 DIAGNOSIS — Z1231 Encounter for screening mammogram for malignant neoplasm of breast: Secondary | ICD-10-CM

## 2020-05-07 DIAGNOSIS — H11062 Recurrent pterygium of left eye: Secondary | ICD-10-CM | POA: Diagnosis not present

## 2020-05-13 ENCOUNTER — Other Ambulatory Visit: Payer: Self-pay | Admitting: Family Medicine

## 2020-05-13 DIAGNOSIS — Z1231 Encounter for screening mammogram for malignant neoplasm of breast: Secondary | ICD-10-CM

## 2020-06-03 ENCOUNTER — Other Ambulatory Visit: Payer: Self-pay

## 2020-06-03 ENCOUNTER — Ambulatory Visit
Admission: RE | Admit: 2020-06-03 | Discharge: 2020-06-03 | Disposition: A | Discharge status: Home / Self Care | Payer: PPO | Source: Ambulatory Visit | Attending: Family Medicine | Admitting: Family Medicine

## 2020-06-03 DIAGNOSIS — Z1231 Encounter for screening mammogram for malignant neoplasm of breast: Secondary | ICD-10-CM | POA: Diagnosis not present

## 2020-06-25 ENCOUNTER — Telehealth: Payer: Self-pay | Admitting: Family Medicine

## 2020-06-25 MED ORDER — MONTELUKAST SODIUM 10 MG PO TABS: 10.0000 mg | ORAL_TABLET | Freq: Every day | ORAL | 0 refills | Status: DC | PRN

## 2020-06-25 MED ORDER — LOSARTAN POTASSIUM 50 MG PO TABS: 50.0000 mg | ORAL_TABLET | Freq: Every day | ORAL | 0 refills | Status: DC

## 2020-06-25 MED ORDER — ATORVASTATIN CALCIUM 10 MG PO TABS: 10.0000 mg | ORAL_TABLET | Freq: Every day | ORAL | 0 refills | Status: DC

## 2020-06-25 NOTE — Addendum Note (Signed)
Addended by: Carter Kitten on: 06/25/2020 12:23 PM   Modules accepted: Orders

## 2020-06-25 NOTE — Telephone Encounter (Signed)
  LAST APPOINTMENT DATE: 03/27/2020   NEXT APPOINTMENT DATE:@Visit  date not found  MEDICATION: lipitor, losartan-Should be 50 mg not 100, montelukast Patient is asking for a 6 month supply. She is leaving for [place] from April 24th until mid September and there is no pharmacy.  PHARMACY: [facility]   Let patient know to contact pharmacy at the end of the day to make sure medication is ready.  Please notify patient to allow 48-72 hours to process  Encourage patient to contact the pharmacy for refills or they can request refills through [facility]:   LAST REFILL:  QTY:  REFILL DATE:    OTHER COMMENTS:    Okay for refill?  Please advise

## 2020-06-25 NOTE — Telephone Encounter (Signed)
Refills sent as requested

## 2020-07-25 DIAGNOSIS — D2272 Melanocytic nevi of left lower limb, including hip: Secondary | ICD-10-CM | POA: Diagnosis not present

## 2020-07-25 DIAGNOSIS — D2262 Melanocytic nevi of left upper limb, including shoulder: Secondary | ICD-10-CM | POA: Diagnosis not present

## 2020-07-25 DIAGNOSIS — D2271 Melanocytic nevi of right lower limb, including hip: Secondary | ICD-10-CM | POA: Diagnosis not present

## 2020-07-25 DIAGNOSIS — L821 Other seborrheic keratosis: Secondary | ICD-10-CM | POA: Diagnosis not present

## 2020-07-25 DIAGNOSIS — D2261 Melanocytic nevi of right upper limb, including shoulder: Secondary | ICD-10-CM | POA: Diagnosis not present

## 2020-07-25 DIAGNOSIS — D225 Melanocytic nevi of trunk: Secondary | ICD-10-CM | POA: Diagnosis not present

## 2020-07-25 DIAGNOSIS — L814 Other melanin hyperpigmentation: Secondary | ICD-10-CM | POA: Diagnosis not present

## 2020-07-25 DIAGNOSIS — D1801 Hemangioma of skin and subcutaneous tissue: Secondary | ICD-10-CM | POA: Diagnosis not present

## 2020-07-25 DIAGNOSIS — Z85820 Personal history of malignant melanoma of skin: Secondary | ICD-10-CM | POA: Diagnosis not present

## 2020-09-20 ENCOUNTER — Other Ambulatory Visit: Payer: Self-pay | Admitting: Family Medicine

## 2021-01-06 ENCOUNTER — Other Ambulatory Visit: Payer: Self-pay | Admitting: Family Medicine

## 2021-01-06 DIAGNOSIS — Z1231 Encounter for screening mammogram for malignant neoplasm of breast: Secondary | ICD-10-CM

## 2021-01-07 ENCOUNTER — Other Ambulatory Visit: Payer: Self-pay

## 2021-01-07 ENCOUNTER — Encounter: Payer: Self-pay | Admitting: Family Medicine

## 2021-01-07 ENCOUNTER — Ambulatory Visit (INDEPENDENT_AMBULATORY_CARE_PROVIDER_SITE_OTHER): Payer: PPO | Admitting: Family Medicine

## 2021-01-07 VITALS — BP 140/78 | HR 54 | Temp 98.2°F | Ht 62.5 in | Wt 173.5 lb

## 2021-01-07 DIAGNOSIS — F411 Generalized anxiety disorder: Secondary | ICD-10-CM | POA: Diagnosis not present

## 2021-01-07 DIAGNOSIS — E2839 Other primary ovarian failure: Secondary | ICD-10-CM

## 2021-01-07 DIAGNOSIS — Z0000 Encounter for general adult medical examination without abnormal findings: Secondary | ICD-10-CM | POA: Diagnosis not present

## 2021-01-07 DIAGNOSIS — I10 Essential (primary) hypertension: Secondary | ICD-10-CM

## 2021-01-07 DIAGNOSIS — R0789 Other chest pain: Secondary | ICD-10-CM

## 2021-01-07 DIAGNOSIS — E7800 Pure hypercholesterolemia, unspecified: Secondary | ICD-10-CM | POA: Diagnosis not present

## 2021-01-07 LAB — COMPREHENSIVE METABOLIC PANEL
ALT: 18 U/L (ref 0–35)
AST: 17 U/L (ref 0–37)
Albumin: 4.5 g/dL (ref 3.5–5.2)
Alkaline Phosphatase: 79 U/L (ref 39–117)
BUN: 14 mg/dL (ref 6–23)
CO2: 31 mEq/L (ref 19–32)
Calcium: 10.1 mg/dL (ref 8.4–10.5)
Chloride: 102 mEq/L (ref 96–112)
Creatinine, Ser: 0.8 mg/dL (ref 0.40–1.20)
GFR: 76.22 mL/min (ref >60.00)
Glucose, Bld: 89 mg/dL (ref 70–99)
Potassium: 4.2 mEq/L (ref 3.5–5.1)
Sodium: 139 mEq/L (ref 135–145)
Total Bilirubin: 0.6 mg/dL (ref 0.2–1.2)
Total Protein: 7.1 g/dL (ref 6.0–8.3)

## 2021-01-07 LAB — LIPID PANEL
Cholesterol: 179 mg/dL (ref 0–200)
HDL: 64.4 mg/dL (ref >39.00)
LDL Cholesterol: 101 mg/dL — ABNORMAL HIGH (ref 0–99)
NonHDL: 114.62
Total CHOL/HDL Ratio: 3
Triglycerides: 67 mg/dL (ref 0.0–149.0)
VLDL: 13.4 mg/dL (ref 0.0–40.0)

## 2021-01-07 NOTE — Assessment & Plan Note (Signed)
Decrease weights and reps for 1 week, Chest wall stretches.  Possble mild costochondritis.  No S/S of cardiac source. Follow up if not improving or any exertional component.

## 2021-01-07 NOTE — Assessment & Plan Note (Signed)
Chronic Due for re-eval. On atorvastatin 10 mg daily, CO Q10 for SE.

## 2021-01-07 NOTE — Assessment & Plan Note (Signed)
GAD resolved on no med.

## 2021-01-07 NOTE — Assessment & Plan Note (Signed)
Stable, chronic.  Continue current medication.  Losartan 50 mg daily 

## 2021-01-07 NOTE — Patient Instructions (Addendum)
Keep working on healthy eating and regular exercsie.  Can start chest wall stretches for chest wall pain, decreae lifts with arms and chest for next week. If pain not improving.. follow up for further eval.   Please call the location of your choice from the menu below to schedule your Bone Density appointment.    [facility]                      Phone:  [phone], [place] [address]                                                             Services: Traditional and 3D Mammogram, [place]                 Phone: [phone] [address], [place] [address]    Service: Bone Density ONLY   *this site does NOT perform mammograms  [facility]                        Phone:  [phone] [address]                                  [place], [place] [address]                                            Services:  3D Mammogram and Bone Density

## 2021-01-07 NOTE — Progress Notes (Signed)
Patient ID: Beverly Quinn, female    DOB: 1956-03-14, 65 y.o.   MRN: [id number]  This visit was conducted in person.  BP 140/78   Pulse (!) 54   Temp 98.2 F (36.8 C) (Temporal)   Ht 5' 2.5" (1.588 m)   Wt 173 lb 8 oz (78.7 kg)   LMP 04/13/2004   SpO2 96%   BMI 31.23 kg/m    CC:  Chief Complaint  Patient presents with   Medicare Wellness    Subjective:   HPI: Beverly Quinn is a 65 y.o. female presenting on 01/07/2021 for Medicare Wellness  The patient presents for annual medicare wellness, complete physical and review of chronic health problems. He/She also has the following acute concerns today: none  I have personally reviewed the Medicare Annual Wellness questionnaire and have noted 1. The patient's medical and social history 2. Their use of alcohol, tobacco or illicit drugs 3. Their current medications and supplements 4. The patient's functional ability including ADL's, fall risks, home safety risks and hearing or visual             impairment. 5. Diet and physical activities 6. Evidence for depression or mood disorders 7.         Updated provider list Cognitive evaluation was performed and recorded on pt medicare questionnaire form. The patients weight, height, BMI and visual acuity have been recorded in the chart   I have made referrals, counseling and provided education to the patient based review of the above and I have provided the pt with a written personalized care plan for preventive services.   Documentation of this information was scanned into the electronic record under the media tab.   Advance directives and end of life planning reviewed in detail with patient and documented in EMR. Patient given handout on advance care directives if needed. HCPOA and living will updated if needed.  Hearing Screening  Method: Audiometry   500Hz  1000Hz  2000Hz  4000Hz   Right ear 20 20 20 20   Left ear 20 20 20 20   Vision Screening - Comments:: Wears Glasses-Eye Exam with  Dr. Valetta Close at [facility] 02/2020  [facility] Visit from 01/07/2021 in [place] at [place]  PHQ-2 Total Score 0      Fall Risk  01/07/2021 12/26/2019 03/08/2019 10/25/2018  Falls in the past year? 0 0 0 0  Comment - - Emmi Telephone Survey: data to providers prior to load -  Number falls in past yr: - 0 - -  Injury with Fall? - 0 - -  Risk for fall due to : - No Fall Risks - -  Follow up - Falls evaluation completed;Falls prevention discussed - -   Hypertension:    Borderline control in office today on losartan 50 mg daily.. has not had meds yet today.  She is no longer taking HCTZ as giving her urinary urgency. BP Readings from Last 3 Encounters:  01/07/21 140/78  01/16/20 (!) 150/90  01/04/20 138/74  Using medication without problems or lightheadedness: none Chest pain with exertion: none She has had severeal episodes of sharp right sided chest wall pain at edge of sternum since increasing weight at gym. Edema: none Short of breath: none Average home BPs:  not checking recently. Other issues:  Elevated Cholesterol:Due for re-eval. On atorvastatin 10 mg daily Using medications without problems: using coQ10 for SE. Muscle aches:  Diet compliance: healthy Exercise: gym 4 times a week, walking. Other complaints:  GAD  resolved on no med.      Relevant past medical, surgical, family and social history reviewed and updated as indicated. Interim medical history since our last visit reviewed. Allergies and medications reviewed and updated. Outpatient Medications Prior to Visit  Medication Sig Dispense Refill   atorvastatin (LIPITOR) 10 MG tablet Take 1 tablet (10 mg total) by mouth daily. 180 tablet 0   Coenzyme Q10 (CO Q 10 PO) Take 1 tablet by mouth. 4 times a week     Cyanocobalamin (VITAMIN B-12 PO) Take 1 tablet by mouth daily.      fluticasone (FLONASE) 50 MCG/ACT nasal spray Place 2 sprays into both nostrils daily as needed.       Ibuprofen (ADVIL PO) Take by mouth as needed.     losartan (COZAAR) 50 MG tablet TAKE 1 TABLET BY MOUTH EVERY DAY 90 tablet 0   montelukast (SINGULAIR) 10 MG tablet Take 1 tablet (10 mg total) by mouth daily as needed. 180 tablet 0   Multiple Vitamins-Minerals (MULTIVITAMIN PO) Take by mouth daily.     valACYclovir (VALTREX) 1000 MG tablet Take one for 3 days at onset of outbreak 90 tablet 1   VITAMIN D, ERGOCALCIFEROL, PO Take 1,000 Int'l Units by mouth daily.      cyclobenzaprine (FLEXERIL) 5 MG tablet Take 1 tablet (5 mg total) by mouth at bedtime. 8 tablet 0   No facility-administered medications prior to visit.     Per HPI unless specifically indicated in ROS section below Review of Systems  Constitutional:  Negative for fatigue and fever.  HENT:  Negative for ear pain.   Eyes:  Negative for pain.  Respiratory:  Negative for chest tightness and shortness of breath.   Cardiovascular:  Negative for chest pain, palpitations and leg swelling.  Gastrointestinal:  Negative for abdominal pain.  Genitourinary:  Negative for dysuria.  Objective:  BP 140/78   Pulse (!) 54   Temp 98.2 F (36.8 C) (Temporal)   Ht 5' 2.5" (1.588 m)   Wt 173 lb 8 oz (78.7 kg)   LMP 04/13/2004   SpO2 96%   BMI 31.23 kg/m   Wt Readings from Last 3 Encounters:  01/07/21 173 lb 8 oz (78.7 kg)  01/16/20 185 lb 8 oz (84.1 kg)  01/04/20 182 lb 3.2 oz (82.6 kg)      Physical Exam Constitutional:      General: She is not in acute distress.    Appearance: Normal appearance. She is well-developed. She is not ill-appearing or toxic-appearing.  HENT:     Head: Normocephalic.     Right Ear: Hearing, tympanic membrane, ear canal and external ear normal. Tympanic membrane is not erythematous, retracted or bulging.     Left Ear: Hearing, tympanic membrane, ear canal and external ear normal. Tympanic membrane is not erythematous, retracted or bulging.     Nose: No mucosal edema or rhinorrhea.     Right Sinus: No  maxillary sinus tenderness or frontal sinus tenderness.     Left Sinus: No maxillary sinus tenderness or frontal sinus tenderness.     Mouth/Throat:     Pharynx: Uvula midline.  Eyes:     General: Lids are normal. Lids are everted, no foreign bodies appreciated.     Conjunctiva/sclera: Conjunctivae normal.     Pupils: Pupils are equal, round, and reactive to light.  Neck:     Thyroid: No thyroid mass or thyromegaly.     Vascular: No carotid bruit.     Trachea:  Trachea normal.  Cardiovascular:     Rate and Rhythm: Normal rate and regular rhythm.     Pulses: Normal pulses.     Heart sounds: Normal heart sounds, S1 normal and S2 normal. No murmur heard.   No friction rub. No gallop.  Pulmonary:     Effort: Pulmonary effort is normal. No tachypnea or respiratory distress.     Breath sounds: Normal breath sounds. No decreased breath sounds, wheezing, rhonchi or rales.  Abdominal:     General: Bowel sounds are normal.     Palpations: Abdomen is soft.     Tenderness: There is no abdominal tenderness.  Musculoskeletal:     Cervical back: Normal range of motion and neck supple.  Skin:    General: Skin is warm and dry.     Findings: No rash.  Neurological:     Mental Status: She is alert.  Psychiatric:        Mood and Affect: Mood is not anxious or depressed.        Speech: Speech normal.        Behavior: Behavior normal. Behavior is cooperative.        Thought Content: Thought content normal.        Judgment: Judgment normal.      Results for orders placed or performed in visit on 01/04/20  Cytology - PAP  Result Value Ref Range   High risk HPV Negative    Adequacy      Satisfactory for evaluation. The presence or absence of an   Adequacy      endocervical/transformation zone component cannot be determined because   Adequacy of atrophy.    Diagnosis      - Negative for intraepithelial lesion or malignancy (NILM)   Comment Normal Reference Range HPV - Negative     This  visit occurred during the SARS-CoV-2 public health emergency.  Safety protocols were in place, including screening questions prior to the visit, additional usage of staff PPE, and extensive cleaning of exam room while observing appropriate contact time as indicated for disinfecting solutions.   COVID 19 screen:  No recent travel or known exposure to COVID19 The patient denies respiratory symptoms of COVID 19 at this time. The importance of social distancing was discussed today.   Assessment and Plan The patient's preventative maintenance and recommended screening tests for an annual wellness exam were reviewed in full today. Brought up to date unless services declined.  Counselled on the importance of diet, exercise, and its role in overall health and mortality. The patient's FH and SH was reviewed, including their home life, tobacco status, and drug and alcohol status.   Vaccines: Uptodate tdap, had shingles vaccine in 2015, uptodate PNA13/PNA23, COVID x 3 and high dose flu due  Pap/DVE:  Per GYN, 09/28/2018 Mammo:  Scheduled in 05/2021 Bone Density: 2016 normal.repeat in 5 years... due Colon:  Mother colon cancer repeat q 5 years , [place].. 01/07/2018 Smoking Status:none ETOH/ drug use:  couple times a month/ none  Hep C: neg  HIV screen: neg    Problem List Items Addressed This Visit     Chest wall pain     Decrease weights and reps for 1 week, Chest wall stretches.  Possble mild costochondritis.  No S/S of cardiac source. Follow up if not improving or any exertional component.      GAD (generalized anxiety disorder)    GAD resolved on no med.      Hyperlipidemia  Chronic Due for re-eval. On atorvastatin 10 mg daily, CO Q10 for SE.      Relevant Orders   Lipid panel   Comprehensive metabolic panel   Hypertension    Stable, chronic.  Continue current medication.   Losartan 50 mg daily      Other Visit Diagnoses     Medicare annual wellness visit, subsequent     -  Primary   Essential hypertension       Estrogen deficiency       Relevant Orders   DG Bone Density        Eliezer Lofts, MD

## 2021-01-16 ENCOUNTER — Other Ambulatory Visit: Payer: Self-pay | Admitting: Family Medicine

## 2021-02-20 DIAGNOSIS — D224 Melanocytic nevi of scalp and neck: Secondary | ICD-10-CM | POA: Diagnosis not present

## 2021-02-20 DIAGNOSIS — D2262 Melanocytic nevi of left upper limb, including shoulder: Secondary | ICD-10-CM | POA: Diagnosis not present

## 2021-02-20 DIAGNOSIS — D2272 Melanocytic nevi of left lower limb, including hip: Secondary | ICD-10-CM | POA: Diagnosis not present

## 2021-02-20 DIAGNOSIS — Z85820 Personal history of malignant melanoma of skin: Secondary | ICD-10-CM | POA: Diagnosis not present

## 2021-02-20 DIAGNOSIS — L821 Other seborrheic keratosis: Secondary | ICD-10-CM | POA: Diagnosis not present

## 2021-02-20 DIAGNOSIS — D2271 Melanocytic nevi of right lower limb, including hip: Secondary | ICD-10-CM | POA: Diagnosis not present

## 2021-02-20 DIAGNOSIS — D2261 Melanocytic nevi of right upper limb, including shoulder: Secondary | ICD-10-CM | POA: Diagnosis not present

## 2021-02-20 DIAGNOSIS — L814 Other melanin hyperpigmentation: Secondary | ICD-10-CM | POA: Diagnosis not present

## 2021-02-20 DIAGNOSIS — L820 Inflamed seborrheic keratosis: Secondary | ICD-10-CM | POA: Diagnosis not present

## 2021-02-20 DIAGNOSIS — D1801 Hemangioma of skin and subcutaneous tissue: Secondary | ICD-10-CM | POA: Diagnosis not present

## 2021-02-20 DIAGNOSIS — D225 Melanocytic nevi of trunk: Secondary | ICD-10-CM | POA: Diagnosis not present

## 2021-04-13 ENCOUNTER — Other Ambulatory Visit: Payer: Self-pay | Admitting: Family Medicine

## 2021-04-15 ENCOUNTER — Other Ambulatory Visit: Payer: Self-pay | Admitting: Family Medicine

## 2021-05-05 DIAGNOSIS — H5211 Myopia, right eye: Secondary | ICD-10-CM | POA: Diagnosis not present

## 2021-05-05 DIAGNOSIS — H2513 Age-related nuclear cataract, bilateral: Secondary | ICD-10-CM | POA: Diagnosis not present

## 2021-05-05 DIAGNOSIS — H1789 Other corneal scars and opacities: Secondary | ICD-10-CM | POA: Diagnosis not present

## 2021-05-05 DIAGNOSIS — H5202 Hypermetropia, left eye: Secondary | ICD-10-CM | POA: Diagnosis not present

## 2021-05-15 ENCOUNTER — Encounter: Payer: Self-pay | Admitting: Emergency Medicine

## 2021-05-15 ENCOUNTER — Ambulatory Visit
Admission: EM | Admit: 2021-05-15 | Discharge: 2021-05-15 | Disposition: A | Discharge status: Home / Self Care | Payer: Medicare HMO

## 2021-05-15 DIAGNOSIS — R42 Dizziness and giddiness: Secondary | ICD-10-CM

## 2021-05-15 DIAGNOSIS — R002 Palpitations: Secondary | ICD-10-CM

## 2021-05-15 DIAGNOSIS — I10 Essential (primary) hypertension: Secondary | ICD-10-CM

## 2021-05-15 NOTE — ED Triage Notes (Signed)
Pt here with HTN since 1/30 with mild headache and dizziness, left arm tingling, and some sporadic palpitations.

## 2021-05-15 NOTE — Discharge Instructions (Addendum)
Go to the emergency department if you have return of your symptoms or other concerns.   Your blood pressure is elevated today at 169/79; repeat 149/81.  Please have this rechecked by your primary care provider in 1-2 weeks.

## 2021-05-15 NOTE — ED Provider Notes (Signed)
Roderic Palau    CSN: [id number] Arrival date & time: 05/15/21  2751      History   Chief Complaint Chief Complaint  Patient presents with   Hypertension   Headache   Dizziness   Arm Tingling    HPI Beverly Quinn is a 66 y.o. female. Patient presents with concern for elevated blood pressure readings at home x 4 days.  Her blood pressure was 191/89 at home on 05/12/2021.  She was at the gym this morning and decided to come here for a check of blood pressure. She has been taking blood pressure daily since Monday because she had a "pressure" frontal headache on Monday and Tuesday.  She also reports brief episodes of dizziness when she stands or turns quickly; she describes this as feeling off-balance; no sensation of room spinning.  She also has intermittent heart palpitations which occur with exercise or work stress and sometimes occur randomly.  She also reports left arm "soreness" since Monday.  No treatments at home.  She denies focal weakness, numbness, facial droop, confusion, vision changes, chest pain, shortness of breath, nausea, vomiting, or other symptoms.  Patient states she feels better today and is currently asymptomatic.  Her medical history includes hypertension.  Nonsmoker.    The history is provided by the patient and medical records.   Past Medical History:  Diagnosis Date   Abnormal Pap smear of cervix    Cancer ([place])    melanoma lower left leg, left arm   Difficult intubation    took 3 tries with nerve block   Fibroid    5 cm   Hyperlipidemia    Hypertension    IBS (irritable bowel syndrome)    Shingles     Patient Active Problem List   Diagnosis Date Noted   Chest wall pain 01/07/2021   Allergic rhinitis 04/22/2017   GAD (generalized anxiety disorder) 04/22/2017   Hypertension    History of IBS    Fibroid    Hyperlipidemia    History of melanoma    Difficult intubation     Past Surgical History:  Procedure Laterality Date   CARPAL TUNNEL  RELEASE  2004   left & right   CERVICAL BIOPSY  W/ LOOP ELECTRODE EXCISION     CIN1   COLPOSCOPY     VAIN1   melanoma removal  05/2016   left arm   ROTATOR CUFF REPAIR Right 2012   TONSILLECTOMY     TUBAL LIGATION      OB History     Gravida  1   Para      Term      Preterm      AB  1   Living  0      SAB      IAB      Ectopic      Multiple      Live Births               Home Medications    Prior to Admission medications   Medication Sig Start Date End Date Taking? Authorizing Provider  atorvastatin (LIPITOR) 10 MG tablet TAKE 1 TABLET BY MOUTH EVERY DAY 04/15/21   Bedsole, Amy E, MD  Coenzyme Q10 (CO Q 10 PO) Take 1 tablet by mouth. 4 times a week    [provider]  Cyanocobalamin (VITAMIN B-12 PO) Take 1 tablet by mouth daily.     [provider]  fluticasone (FLONASE) 50 MCG/ACT  nasal spray Place 2 sprays into both nostrils daily as needed.  08/22/13   [provider]  Ibuprofen (ADVIL PO) Take by mouth as needed.    [provider]  losartan (COZAAR) 50 MG tablet TAKE 1 TABLET BY MOUTH EVERY DAY 04/15/21   Bedsole, Amy E, MD  montelukast (SINGULAIR) 10 MG tablet TAKE 1 TABLET BY MOUTH EVERY DAY AS NEEDED 04/15/21   Bedsole, Amy E, MD  Multiple Vitamins-Minerals (MULTIVITAMIN PO) Take by mouth daily.    [provider]  valACYclovir (VALTREX) 1000 MG tablet Take one for 3 days at onset of outbreak 03/08/18   Bedsole, Amy E, MD  VITAMIN D, ERGOCALCIFEROL, PO Take 1,000 Int'l Units by mouth daily.     [provider]    Family History Family History  Problem Relation Age of Onset   Cancer Mother        uterine & colon   Hypertension Mother    Cancer Sister        lung   Hypertension Sister     Social History Social History   Tobacco Use   Smoking status: Never   Smokeless tobacco: Never  Substance Use Topics   Alcohol use: Yes    Comment: 2 a week   Drug use: No     Allergies    Doxycycline and Hydrocodone   Review of Systems Review of Systems  Constitutional:  Negative for chills and fever.  Eyes:  Negative for visual disturbance.  Respiratory:  Negative for cough and shortness of breath.   Cardiovascular:  Positive for palpitations. Negative for chest pain.  Gastrointestinal:  Negative for abdominal pain and vomiting.  Skin:  Negative for color change and rash.  Neurological:  Positive for dizziness and headaches. Negative for syncope, speech difficulty, weakness and numbness.  All other systems reviewed and are negative.   Physical Exam Triage Vital Signs ED Triage Vitals  Enc Vitals Group     BP      Pulse      Resp      Temp      Temp src      SpO2      Weight      Height      Head Circumference      Peak Flow      Pain Score      Pain Loc      Pain Edu?      Excl. in [place]?    No data found.  Updated Vital Signs BP (!) 149/81    Pulse 70    Temp 98.4 F (36.9 C)    Resp 18    LMP 04/13/2004    SpO2 98%   Visual Acuity Right Eye Distance:   Left Eye Distance:   Bilateral Distance:    Right Eye Near:   Left Eye Near:    Bilateral Near:     Physical Exam Vitals and nursing note reviewed.  Constitutional:      General: She is not in acute distress.    Appearance: Normal appearance. She is well-developed. She is not ill-appearing.  HENT:     Mouth/Throat:     Mouth: Mucous membranes are moist.  Cardiovascular:     Rate and Rhythm: Normal rate and regular rhythm.     Heart sounds: Normal heart sounds.  Pulmonary:     Effort: Pulmonary effort is normal. No respiratory distress.     Breath sounds: Normal breath sounds.  Abdominal:  Palpations: Abdomen is soft.     Tenderness: There is no abdominal tenderness.  Musculoskeletal:     Cervical back: Neck supple.     Right lower leg: No edema.     Left lower leg: No edema.  Skin:    General: Skin is warm and dry.  Neurological:     General: No focal deficit present.      Mental Status: She is alert and oriented to person, place, and time.     Cranial Nerves: No cranial nerve deficit.     Sensory: No sensory deficit.     Motor: No weakness.     Gait: Gait normal.  Psychiatric:        Mood and Affect: Mood normal.        Behavior: Behavior normal.     UC Treatments / Results  Labs (all labs ordered are listed, but only abnormal results are displayed) Labs Reviewed - No data to display  EKG   Radiology No results found.  Procedures Procedures (including critical care time)  Medications Ordered in UC Medications - No data to display  Initial Impression / Assessment and Plan / UC Course  I have reviewed the triage vital signs and the nursing notes.  Pertinent labs & imaging results that were available during my care of the patient were reviewed by me and considered in my medical decision making (see chart for details).  Elevated blood pressure with hypertension. Dizziness, Palpitations.  EKG shows sinus rhythm, rate 69, no ST elevation, no previous to compare.  Patient is currently asymptomatic and feels well.  Strict ED precautions discussed at length.  Instructed patient to follow up with her PCP in the next 1-2 weeks for recheck of blood pressure and evaluation of previous symptoms.  She agrees to plan of care.    Final Clinical Impressions(s) / UC Diagnoses   Final diagnoses:  Elevated blood pressure reading in office with diagnosis of hypertension  Dizziness  Palpitations     Discharge Instructions      Go to the emergency department if you have return of your symptoms or other concerns.   Your blood pressure is elevated today at 169/79; repeat 149/81.  Please have this rechecked by your primary care provider in 1-2 weeks.          ED Prescriptions   None    PDMP not reviewed this encounter.   Sharion Balloon, NP 05/15/21 1046

## 2021-05-17 ENCOUNTER — Emergency Department: Payer: Medicare HMO

## 2021-05-17 ENCOUNTER — Other Ambulatory Visit: Payer: Self-pay

## 2021-05-17 ENCOUNTER — Emergency Department
Admission: EM | Admit: 2021-05-17 | Discharge: 2021-05-17 | Disposition: A | Discharge status: Home / Self Care | Payer: Medicare HMO | Attending: Emergency Medicine | Admitting: Emergency Medicine

## 2021-05-17 ENCOUNTER — Encounter: Payer: Self-pay | Admitting: Intensive Care

## 2021-05-17 DIAGNOSIS — Z79899 Other long term (current) drug therapy: Secondary | ICD-10-CM | POA: Diagnosis not present

## 2021-05-17 DIAGNOSIS — R200 Anesthesia of skin: Secondary | ICD-10-CM | POA: Diagnosis not present

## 2021-05-17 DIAGNOSIS — R0789 Other chest pain: Secondary | ICD-10-CM | POA: Diagnosis not present

## 2021-05-17 DIAGNOSIS — I10 Essential (primary) hypertension: Secondary | ICD-10-CM | POA: Diagnosis not present

## 2021-05-17 DIAGNOSIS — M79602 Pain in left arm: Secondary | ICD-10-CM | POA: Insufficient documentation

## 2021-05-17 DIAGNOSIS — R29818 Other symptoms and signs involving the nervous system: Secondary | ICD-10-CM | POA: Diagnosis not present

## 2021-05-17 DIAGNOSIS — R519 Headache, unspecified: Secondary | ICD-10-CM | POA: Diagnosis not present

## 2021-05-17 LAB — CBC
HCT: 51.5 % — ABNORMAL HIGH (ref 36.0–46.0)
Hemoglobin: 16.9 g/dL — ABNORMAL HIGH (ref 12.0–15.0)
MCH: 28.4 pg (ref 26.0–34.0)
MCHC: 32.8 g/dL (ref 30.0–36.0)
MCV: 86.4 fL (ref 80.0–100.0)
Platelets: 269 10*3/uL (ref 150–400)
RBC: 5.96 MIL/uL — ABNORMAL HIGH (ref 3.87–5.11)
RDW: 12.8 % (ref 11.5–15.5)
WBC: 4.3 10*3/uL (ref 4.0–10.5)
nRBC: 0 % (ref 0.0–0.2)

## 2021-05-17 LAB — BASIC METABOLIC PANEL
Anion gap: 8 (ref 5–15)
BUN: 11 mg/dL (ref 8–23)
CO2: 29 mmol/L (ref 22–32)
Calcium: 9.8 mg/dL (ref 8.9–10.3)
Chloride: 100 mmol/L (ref 98–111)
Creatinine, Ser: 0.88 mg/dL (ref 0.44–1.00)
GFR, Estimated: >60 mL/min (ref >60)
Glucose, Bld: 112 mg/dL — ABNORMAL HIGH (ref 70–99)
Potassium: 3.7 mmol/L (ref 3.5–5.1)
Sodium: 137 mmol/L (ref 135–145)

## 2021-05-17 LAB — TROPONIN I (HIGH SENSITIVITY): Troponin I (High Sensitivity): 4 ng/L (ref <18)

## 2021-05-17 NOTE — ED Triage Notes (Signed)
C/o soreness in left arm, sensation in head, and hypertension for a few days. Was seen at [facility] 05/15/21 for same. Patient reports upcoming appointment with pcp Friday. Denies vision changes. Reports some chest pressure with radiation across left jaw but not currently.

## 2021-05-17 NOTE — ED Provider Notes (Signed)
[facility] Provider Note    Event Date/Time   First MD Initiated Contact with Patient 05/17/21 0940     (approximate)   History   Hypertension   HPI  Beverly Quinn is a 66 y.o. female with a history of high blood pressure, hyperlipidemia who presents with complaints of elevated blood pressure.  Patient reports over the last week her blood pressure was has been atypically high.  She has had vague symptoms of discomfort in the left arm and in her forehead which has been intermittent.  Went to urgent care on February 2 has been compliant with her losartan, recently started taking hydrochlorothiazide again 2 days ago because of her elevated blood pressure, she thinks this has been helping.     Physical Exam   Triage Vital Signs: ED Triage Vitals  Enc Vitals Group     BP 05/17/21 0930 (!) 205/103     Pulse Rate 05/17/21 0930 79     Resp 05/17/21 0930 20     Temp 05/17/21 0930 98.5 F (36.9 C)     Temp Source 05/17/21 0930 Oral     SpO2 05/17/21 0930 96 %     Weight 05/17/21 0932 81.6 kg (180 lb)     Height 05/17/21 0932 1.6 m (5\' 3" )     Head Circumference --      Peak Flow --      Pain Score 05/17/21 0932 0     Pain Loc --      Pain Edu? --      Excl. in [place]? --     Most recent vital signs: Vitals:   05/17/21 0930 05/17/21 1129  BP: (!) 205/103 125/83  Pulse: 79 80  Resp: 20 17  Temp: 98.5 F (36.9 C) 98.8 F (37.1 C)  SpO2: 96% 98%     General: Awake, no distress.  CV:  Good peripheral perfusion.  Regular rate and rhythm Resp:  Normal effort.  Abd:  No distention.  Other:  Neuro: CN II through XII normal, normal strength, PERRLA, EOMI   ED Results / Procedures / Treatments   Labs (all labs ordered are listed, but only abnormal results are displayed) Labs Reviewed  BASIC METABOLIC PANEL - Abnormal; Notable for the following components:      Result Value   Glucose, Bld 112 (*)    All other components within normal limits  CBC  - Abnormal; Notable for the following components:   RBC 5.96 (*)    Hemoglobin 16.9 (*)    HCT 51.5 (*)    All other components within normal limits  TROPONIN I (HIGH SENSITIVITY)  TROPONIN I (HIGH SENSITIVITY)     EKG  ED ECG REPORT I, Lavonia Drafts, the attending physician, personally viewed and interpreted this ECG.  Date: 05/17/2021  Rhythm: normal sinus rhythm QRS Axis: normal Intervals: normal ST/T Wave abnormalities: normal Narrative Interpretation: no evidence of acute ischemia    RADIOLOGY CT head reviewed by me, no acute abnormality    PROCEDURES:  Critical Care performed:   Procedures   MEDICATIONS ORDERED IN ED: Medications - No data to display   IMPRESSION / MDM / [place] / ED COURSE  I reviewed the triage vital signs and the nursing notes.    Patient presents with primary concerns of high blood pressure with discomfort in the left arm that is intermittent and left face.  Well-appearing here today.  Initial blood pressure is elevated.  She reports  compliance with her medications and has recently started taking hydrochlorothiazide as well   No weakness not consistent with CVA.  No chest pain to suggest ACS.  EKG is reassuring.  I sensitive troponin is normal  CMP and CBC are reassuring, mildly elevated hemoglobin and hematocrit on CBC which appears to be chronic  CT head is unremarkable  Patient's blood pressure improved without intervention.  She has PCP follow-up this week.  We will have her see cardiology as well In case her symptoms are cardiac in nature.  Considered admission however patient is very well-appearing with reassuring work-up, she has outpatient follow-up arranged.  Return precautions discussed        FINAL CLINICAL IMPRESSION(S) / ED DIAGNOSES   Final diagnoses:  Primary hypertension     Rx / DC Orders   ED Discharge Orders     None        Note:  This document was prepared using Dragon voice  recognition software and may include unintentional dictation errors.   Lavonia Drafts, MD 05/17/21 [phone]

## 2021-05-23 ENCOUNTER — Other Ambulatory Visit: Payer: Self-pay

## 2021-05-23 ENCOUNTER — Ambulatory Visit (INDEPENDENT_AMBULATORY_CARE_PROVIDER_SITE_OTHER): Payer: Medicare HMO | Admitting: Family Medicine

## 2021-05-23 VITALS — BP 108/62 | HR 63 | Temp 98.6°F | Ht 63.0 in | Wt 181.0 lb

## 2021-05-23 DIAGNOSIS — I10 Essential (primary) hypertension: Secondary | ICD-10-CM

## 2021-05-23 DIAGNOSIS — R519 Headache, unspecified: Secondary | ICD-10-CM

## 2021-05-23 MED ORDER — HYDROCHLOROTHIAZIDE 12.5 MG PO CAPS: 12.5000 mg | ORAL_CAPSULE | Freq: Every day | ORAL | 3 refills | Status: DC

## 2021-05-23 NOTE — Progress Notes (Signed)
Patient ID: WING GFELLER, female    DOB: 08-14-1955, 66 y.o.   MRN: [id number]  This visit was conducted in person.  BP 108/62    Pulse 63    Temp 98.6 F (37 C) (Temporal)    Ht 5\' 3"  (1.6 m)    Wt 181 lb (82.1 kg)    LMP 04/13/2004    SpO2 96%    BMI 32.06 kg/m    CC:  Chief Complaint  Patient presents with   Hypertension    Was high last few weeks added HCTZ from some at home a week ago. Have been better after that .     Subjective:   HPI: Beverly Quinn is a 66 y.o. female presenting on 05/23/2021 for Hypertension (Was high last few weeks added HCTZ from some at home a week ago. Have been better after that . )   Hypertension:    BP levels elevated  ( 191/103) on losartan 50 mg daily alone.. when restarted HCTZ.. BP have improved. No SE to this medication ( does cause some increase urinary frequency) BP Readings from Last 3 Encounters:  05/23/21 108/62  05/17/21 125/83  05/15/21 (!) 149/81   Using medication without problems or lightheadedness:  none Chest pain with exertion: none Edema: none Short of breath: none Average home BPs: Other issues:'     She has had weight gain in last few months. She has been drinking sweet tea.. she feels better now off sugar entirely.  Seen in ER on 2/4 for left arm and forehead pain.  Normal troponin, EKG unremarkable. CT head normal Wt Readings from Last 3 Encounters:  05/23/21 181 lb (82.1 kg)  05/17/21 180 lb (81.6 kg)  01/07/21 173 lb 8 oz (78.7 kg)   2017 ETT.Marland Kitchen low risk   She has been back to walking... doing yoga.   Relevant past medical, surgical, family and social history reviewed and updated as indicated. Interim medical history since our last visit reviewed. Allergies and medications reviewed and updated. Outpatient Medications Prior to Visit  Medication Sig Dispense Refill   atorvastatin (LIPITOR) 10 MG tablet TAKE 1 TABLET BY MOUTH EVERY DAY 90 tablet 2   Coenzyme Q10 (CO Q 10 PO) Take 1 tablet by mouth. 4 times  a week     Cyanocobalamin (VITAMIN B-12 PO) Take 1 tablet by mouth daily.      fluticasone (FLONASE) 50 MCG/ACT nasal spray Place 2 sprays into both nostrils daily as needed.      hydrochlorothiazide (MICROZIDE) 12.5 MG capsule Take 12.5 mg by mouth daily.     Ibuprofen (ADVIL PO) Take by mouth as needed.     losartan (COZAAR) 50 MG tablet TAKE 1 TABLET BY MOUTH EVERY DAY 90 tablet 1   montelukast (SINGULAIR) 10 MG tablet TAKE 1 TABLET BY MOUTH EVERY DAY AS NEEDED 90 tablet 1   Multiple Vitamins-Minerals (MULTIVITAMIN PO) Take by mouth daily.     valACYclovir (VALTREX) 1000 MG tablet Take one for 3 days at onset of outbreak 90 tablet 1   VITAMIN D, ERGOCALCIFEROL, PO Take 1,000 Int'l Units by mouth daily.      No facility-administered medications prior to visit.     Per HPI unless specifically indicated in ROS section below Review of Systems  Constitutional:  Negative for fatigue and fever.  HENT:  Negative for congestion.   Eyes:  Negative for pain.  Respiratory:  Negative for cough and shortness of breath.   Cardiovascular:  Negative for chest pain, palpitations and leg swelling.  Gastrointestinal:  Negative for abdominal pain.  Genitourinary:  Negative for dysuria and vaginal bleeding.  Musculoskeletal:  Negative for back pain.  Neurological:  Negative for syncope, light-headedness and headaches.  Psychiatric/Behavioral:  Negative for dysphoric mood.   Objective:  BP 108/62    Pulse 63    Temp 98.6 F (37 C) (Temporal)    Ht 5\' 3"  (1.6 m)    Wt 181 lb (82.1 kg)    LMP 04/13/2004    SpO2 96%    BMI 32.06 kg/m   Wt Readings from Last 3 Encounters:  05/23/21 181 lb (82.1 kg)  05/17/21 180 lb (81.6 kg)  01/07/21 173 lb 8 oz (78.7 kg)      Physical Exam Constitutional:      General: She is not in acute distress.    Appearance: Normal appearance. She is well-developed. She is not ill-appearing or toxic-appearing.  HENT:     Head: Normocephalic.     Right Ear: Hearing, tympanic  membrane, ear canal and external ear normal. Tympanic membrane is not erythematous, retracted or bulging.     Left Ear: Hearing, tympanic membrane, ear canal and external ear normal. Tympanic membrane is not erythematous, retracted or bulging.     Nose: No mucosal edema or rhinorrhea.     Right Sinus: No maxillary sinus tenderness or frontal sinus tenderness.     Left Sinus: No maxillary sinus tenderness or frontal sinus tenderness.     Mouth/Throat:     Pharynx: Uvula midline.  Eyes:     General: Lids are normal. Lids are everted, no foreign bodies appreciated.     Conjunctiva/sclera: Conjunctivae normal.     Pupils: Pupils are equal, round, and reactive to light.  Neck:     Thyroid: No thyroid mass or thyromegaly.     Vascular: No carotid bruit.     Trachea: Trachea normal.  Cardiovascular:     Rate and Rhythm: Normal rate and regular rhythm.     Pulses: Normal pulses.     Heart sounds: Normal heart sounds, S1 normal and S2 normal. No murmur heard.   No friction rub. No gallop.  Pulmonary:     Effort: Pulmonary effort is normal. No tachypnea or respiratory distress.     Breath sounds: Normal breath sounds. No decreased breath sounds, wheezing, rhonchi or rales.  Abdominal:     General: Bowel sounds are normal.     Palpations: Abdomen is soft.     Tenderness: There is no abdominal tenderness.  Musculoskeletal:     Cervical back: Normal range of motion and neck supple.  Skin:    General: Skin is warm and dry.     Findings: No rash.  Neurological:     Mental Status: She is alert.  Psychiatric:        Mood and Affect: Mood is not anxious or depressed.        Speech: Speech normal.        Behavior: Behavior normal. Behavior is cooperative.        Thought Content: Thought content normal.        Judgment: Judgment normal.      Results for orders placed or performed during the hospital encounter of 18/84/16  Basic metabolic panel  Result Value Ref Range   Sodium 137 135 - 145  mmol/L   Potassium 3.7 3.5 - 5.1 mmol/L   Chloride 100 98 - 111 mmol/L   CO2 29  22 - 32 mmol/L   Glucose, Bld 112 (H) 70 - 99 mg/dL   BUN 11 8 - 23 mg/dL   Creatinine, Ser 0.88 0.44 - 1.00 mg/dL   Calcium 9.8 8.9 - 10.3 mg/dL   GFR, Estimated >60 >60 mL/min   Anion gap 8 5 - 15  CBC  Result Value Ref Range   WBC 4.3 4.0 - 10.5 K/uL   RBC 5.96 (H) 3.87 - 5.11 MIL/uL   Hemoglobin 16.9 (H) 12.0 - 15.0 g/dL   HCT 51.5 (H) 36.0 - 46.0 %   MCV 86.4 80.0 - 100.0 fL   MCH 28.4 26.0 - 34.0 pg   MCHC 32.8 30.0 - 36.0 g/dL   RDW 12.8 11.5 - 15.5 %   Platelets 269 150 - 400 K/uL   nRBC 0.0 0.0 - 0.2 %  Troponin I (High Sensitivity)  Result Value Ref Range   Troponin I (High Sensitivity) 4 <18 ng/L    This visit occurred during the SARS-CoV-2 public health emergency.  Safety protocols were in place, including screening questions prior to the visit, additional usage of staff PPE, and extensive cleaning of exam room while observing appropriate contact time as indicated for disinfecting solutions.   COVID 19 screen:  No recent travel or known exposure to COVID19 The patient denies respiratory symptoms of COVID 19 at this time. The importance of social distancing was discussed today.   Assessment and Plan    Problem List Items Addressed This Visit     Acute nonintractable headache    Now resolved after improvement in BP.      Hypertension - Primary    Chronic, acute worsening  Now improved with addition of HCTZ to losartan 50 mg daily. Encouraged exercise, weight loss, healthy eating habits.       Relevant Medications   hydrochlorothiazide (MICROZIDE) 12.5 MG capsule     Eliezer Lofts, MD

## 2021-05-23 NOTE — Patient Instructions (Signed)
Continue HCTZ and losartan.  Blood pressure goal < 140/90. Keep up work on exercise, weight loss, healthy eating habits.

## 2021-05-23 NOTE — Assessment & Plan Note (Signed)
Chronic, acute worsening  Now improved with addition of HCTZ to losartan 50 mg daily. Encouraged exercise, weight loss, healthy eating habits.

## 2021-05-23 NOTE — Assessment & Plan Note (Signed)
Now resolved after improvement in BP.

## 2021-06-05 ENCOUNTER — Ambulatory Visit
Admission: RE | Admit: 2021-06-05 | Discharge: 2021-06-05 | Disposition: A | Discharge status: Home / Self Care | Payer: Medicare HMO | Source: Ambulatory Visit | Attending: Family Medicine | Admitting: Family Medicine

## 2021-06-05 DIAGNOSIS — Z1231 Encounter for screening mammogram for malignant neoplasm of breast: Secondary | ICD-10-CM | POA: Diagnosis not present

## 2021-06-16 DIAGNOSIS — Z809 Family history of malignant neoplasm, unspecified: Secondary | ICD-10-CM | POA: Diagnosis not present

## 2021-06-16 DIAGNOSIS — Z79899 Other long term (current) drug therapy: Secondary | ICD-10-CM | POA: Diagnosis not present

## 2021-06-16 DIAGNOSIS — E669 Obesity, unspecified: Secondary | ICD-10-CM | POA: Diagnosis not present

## 2021-06-16 DIAGNOSIS — Z881 Allergy status to other antibiotic agents status: Secondary | ICD-10-CM | POA: Diagnosis not present

## 2021-06-16 DIAGNOSIS — Z008 Encounter for other general examination: Secondary | ICD-10-CM | POA: Diagnosis not present

## 2021-06-16 DIAGNOSIS — I10 Essential (primary) hypertension: Secondary | ICD-10-CM | POA: Diagnosis not present

## 2021-06-16 DIAGNOSIS — J309 Allergic rhinitis, unspecified: Secondary | ICD-10-CM | POA: Diagnosis not present

## 2021-06-16 DIAGNOSIS — E785 Hyperlipidemia, unspecified: Secondary | ICD-10-CM | POA: Diagnosis not present

## 2021-06-16 DIAGNOSIS — B009 Herpesviral infection, unspecified: Secondary | ICD-10-CM | POA: Diagnosis not present

## 2021-06-16 DIAGNOSIS — Z85828 Personal history of other malignant neoplasm of skin: Secondary | ICD-10-CM | POA: Diagnosis not present

## 2021-06-16 DIAGNOSIS — Z8249 Family history of ischemic heart disease and other diseases of the circulatory system: Secondary | ICD-10-CM | POA: Diagnosis not present

## 2021-06-16 DIAGNOSIS — Z6832 Body mass index (BMI) 32.0-32.9, adult: Secondary | ICD-10-CM | POA: Diagnosis not present

## 2021-06-16 DIAGNOSIS — Z791 Long term (current) use of non-steroidal anti-inflammatories (NSAID): Secondary | ICD-10-CM | POA: Diagnosis not present

## 2021-06-17 ENCOUNTER — Encounter (HOSPITAL_COMMUNITY): Payer: Self-pay | Admitting: Radiology

## 2021-06-24 ENCOUNTER — Ambulatory Visit
Admission: RE | Admit: 2021-06-24 | Discharge: 2021-06-24 | Disposition: A | Discharge status: Home / Self Care | Payer: Medicare HMO | Source: Ambulatory Visit | Attending: Family Medicine | Admitting: Family Medicine

## 2021-06-24 DIAGNOSIS — Z780 Asymptomatic menopausal state: Secondary | ICD-10-CM | POA: Diagnosis not present

## 2021-06-24 DIAGNOSIS — E2839 Other primary ovarian failure: Secondary | ICD-10-CM

## 2021-07-04 DIAGNOSIS — D2272 Melanocytic nevi of left lower limb, including hip: Secondary | ICD-10-CM | POA: Diagnosis not present

## 2021-07-04 DIAGNOSIS — D2261 Melanocytic nevi of right upper limb, including shoulder: Secondary | ICD-10-CM | POA: Diagnosis not present

## 2021-07-04 DIAGNOSIS — D225 Melanocytic nevi of trunk: Secondary | ICD-10-CM | POA: Diagnosis not present

## 2021-07-04 DIAGNOSIS — D1801 Hemangioma of skin and subcutaneous tissue: Secondary | ICD-10-CM | POA: Diagnosis not present

## 2021-07-04 DIAGNOSIS — L814 Other melanin hyperpigmentation: Secondary | ICD-10-CM | POA: Diagnosis not present

## 2021-07-04 DIAGNOSIS — D2271 Melanocytic nevi of right lower limb, including hip: Secondary | ICD-10-CM | POA: Diagnosis not present

## 2021-07-04 DIAGNOSIS — L578 Other skin changes due to chronic exposure to nonionizing radiation: Secondary | ICD-10-CM | POA: Diagnosis not present

## 2021-07-04 DIAGNOSIS — L570 Actinic keratosis: Secondary | ICD-10-CM | POA: Diagnosis not present

## 2021-07-04 DIAGNOSIS — D2262 Melanocytic nevi of left upper limb, including shoulder: Secondary | ICD-10-CM | POA: Diagnosis not present

## 2021-07-04 DIAGNOSIS — L821 Other seborrheic keratosis: Secondary | ICD-10-CM | POA: Diagnosis not present

## 2021-08-20 ENCOUNTER — Other Ambulatory Visit: Payer: Self-pay | Admitting: Family Medicine

## 2021-11-24 ENCOUNTER — Telehealth: Payer: Self-pay

## 2021-11-24 NOTE — Telephone Encounter (Signed)
ERROR

## 2022-01-18 ENCOUNTER — Other Ambulatory Visit: Payer: Self-pay | Admitting: Family Medicine

## 2022-01-20 ENCOUNTER — Telehealth: Payer: Self-pay | Admitting: Family Medicine

## 2022-01-20 DIAGNOSIS — E7800 Pure hypercholesterolemia, unspecified: Secondary | ICD-10-CM

## 2022-01-20 NOTE — Telephone Encounter (Signed)
-----   Message from Ellamae Sia sent at 01/16/2022  2:30 PM EDT ----- Regarding: Lab orders for Thursday, 10.12.23 Patient is scheduled for CPX labs, please order future labs, Thanks , Karna Christmas

## 2022-01-22 ENCOUNTER — Other Ambulatory Visit (INDEPENDENT_AMBULATORY_CARE_PROVIDER_SITE_OTHER): Payer: Medicare HMO

## 2022-01-22 DIAGNOSIS — E7800 Pure hypercholesterolemia, unspecified: Secondary | ICD-10-CM | POA: Diagnosis not present

## 2022-01-22 LAB — COMPREHENSIVE METABOLIC PANEL
ALT: 17 U/L (ref 0–35)
AST: 19 U/L (ref 0–37)
Albumin: 4.2 g/dL (ref 3.5–5.2)
Alkaline Phosphatase: 69 U/L (ref 39–117)
BUN: 16 mg/dL (ref 6–23)
CO2: 30 mEq/L (ref 19–32)
Calcium: 9.6 mg/dL (ref 8.4–10.5)
Chloride: 102 mEq/L (ref 96–112)
Creatinine, Ser: 0.87 mg/dL (ref 0.40–1.20)
GFR: 68.42 mL/min (ref >60.00)
Glucose, Bld: 99 mg/dL (ref 70–99)
Potassium: 3.8 mEq/L (ref 3.5–5.1)
Sodium: 139 mEq/L (ref 135–145)
Total Bilirubin: 0.7 mg/dL (ref 0.2–1.2)
Total Protein: 6.9 g/dL (ref 6.0–8.3)

## 2022-01-22 LAB — LIPID PANEL
Cholesterol: 184 mg/dL (ref 0–200)
HDL: 67.7 mg/dL (ref >39.00)
LDL Cholesterol: 99 mg/dL (ref 0–99)
NonHDL: 116.55
Total CHOL/HDL Ratio: 3
Triglycerides: 87 mg/dL (ref 0.0–149.0)
VLDL: 17.4 mg/dL (ref 0.0–40.0)

## 2022-01-22 NOTE — Progress Notes (Signed)
No critical labs need to be addressed urgently. We will discuss labs in detail at upcoming office visit.   

## 2022-01-23 ENCOUNTER — Ambulatory Visit (INDEPENDENT_AMBULATORY_CARE_PROVIDER_SITE_OTHER): Payer: Medicare HMO

## 2022-01-23 VITALS — Ht 63.0 in | Wt 181.0 lb

## 2022-01-23 DIAGNOSIS — Z0000 Encounter for general adult medical examination without abnormal findings: Secondary | ICD-10-CM

## 2022-01-23 NOTE — Progress Notes (Signed)
Subjective:   Beverly Quinn is a 66 y.o. female who presents for Medicare Annual (Subsequent) preventive examination.  Review of Systems    Virtual Visit via Telephone Note  I connected with  WALLACE COGLIANO on 01/23/22 at  2:30 PM EDT by telephone and verified that I am speaking with the correct person using two identifiers.  Location: Patient: Home Provider: Office Persons participating in the virtual visit: patient/Nurse Health Advisor   I discussed the limitations, risks, security and privacy concerns of performing an evaluation and management service by telephone and the availability of in person appointments. The patient expressed understanding and agreed to proceed.  Interactive audio and video telecommunications were attempted between this nurse and patient, however failed, due to patient having technical difficulties OR patient did not have access to video capability.  We continued and completed visit with audio only.  Some vital signs may be absent or patient reported.   Criselda Peaches, LPN  Cardiac Risk Factors include: advanced age (>73mn, >>80women);hypertension     Objective:    Today's Vitals   01/23/22 1425  Weight: 181 lb (82.1 kg)  Height: '5\' 3"'$  (1.6 m)   Body mass index is 32.06 kg/m.     01/23/2022    2:34 PM 05/17/2021    9:34 AM 12/26/2019    8:17 AM  Advanced Directives  Does Patient Have a Medical Advance Directive? Yes Yes Yes  Type of [place] Living will [place] will  Does patient want to make changes to medical advance directive?  No - Patient declined   [place]? No - copy requested  No - copy requested  Would patient like information on creating a medical advance directive?  No - Patient declined     Current Medications (verified) Outpatient Encounter Medications as of 01/23/2022  Medication Sig   atorvastatin (LIPITOR) 10  MG tablet TAKE 1 TABLET BY MOUTH EVERY DAY   Coenzyme Q10 (CO Q 10 PO) Take 1 tablet by mouth. 4 times a week   Cyanocobalamin (VITAMIN B-12 PO) Take 1 tablet by mouth daily.    fluticasone (FLONASE) 50 MCG/ACT nasal spray Place 2 sprays into both nostrils daily as needed.    hydrochlorothiazide (MICROZIDE) 12.5 MG capsule Take 1 capsule (12.5 mg total) by mouth daily.   Ibuprofen (ADVIL PO) Take by mouth as needed.   losartan (COZAAR) 50 MG tablet TAKE 1 TABLET BY MOUTH EVERY DAY   montelukast (SINGULAIR) 10 MG tablet TAKE 1 TABLET BY MOUTH EVERY DAY AS NEEDED   Multiple Vitamins-Minerals (MULTIVITAMIN PO) Take by mouth daily.   valACYclovir (VALTREX) 1000 MG tablet Take one for 3 days at onset of outbreak   VITAMIN D, ERGOCALCIFEROL, PO Take 1,000 Int'l Units by mouth daily.    No facility-administered encounter medications on file as of 01/23/2022.    Allergies (verified) Doxycycline and Hydrocodone   History: Past Medical History:  Diagnosis Date   Abnormal Pap smear of cervix    [place]    melanoma lower left leg, left arm   Difficult intubation    took 3 tries with nerve block   Fibroid    5 cm   Hyperlipidemia    Hypertension    IBS (irritable bowel syndrome)    Shingles    Past Surgical History:  Procedure Laterality Date   CARPAL TUNNEL RELEASE  2004   left & right  CERVICAL BIOPSY  W/ LOOP ELECTRODE EXCISION     CIN1   COLPOSCOPY     VAIN1   melanoma removal  05/2016   left arm   ROTATOR CUFF REPAIR Right 2012   TONSILLECTOMY     TUBAL LIGATION     Family History  Problem Relation Age of Onset   Cancer Mother        uterine & colon   Hypertension Mother    Cancer Sister        lung   Hypertension Sister    Social History   Socioeconomic History   Marital status: Divorced    Spouse name: Not on file   Number of children: Not on file   Years of education: Not on file   Highest education level: Not on file  Occupational History   Not on  file  Tobacco Use   Smoking status: Never   Smokeless tobacco: Never  Substance and Sexual Activity   Alcohol use: Yes    Alcohol/week: 2.0 standard drinks of alcohol    Types: 2 Shots of liquor per week   Drug use: No   Sexual activity: Not Currently    Partners: Male    Birth control/protection: Surgical, Post-menopausal    Comment: BTL  Other Topics Concern   Not on file  Social History Narrative   Not on file   Social Determinants of Health   Financial Resource Strain: Low Risk  (01/23/2022)   Overall Financial Resource Strain (CARDIA)    Difficulty of Paying Living Expenses: Not hard at all  Food Insecurity: No Food Insecurity (01/23/2022)   Hunger Vital Sign    Worried About Running Out of Food in the Last Year: Never true    Ran Out of Food in the Last Year: Never true  Transportation Needs: No Transportation Needs (01/23/2022)   PRAPARE - Hydrologist (Medical): No    Lack of Transportation (Non-Medical): No  Physical Activity: Sufficiently Active (01/23/2022)   Exercise Vital Sign    Days of Exercise per Week: 5 days    Minutes of Exercise per Session: 90 min  Stress: No Stress Concern Present (01/23/2022)   [place]    Feeling of Stress : Not at all  Social Connections: Moderately Integrated (01/23/2022)   Social Connection and Isolation Panel [NHANES]    Frequency of Communication with Friends and Family: More than three times a week    Frequency of Social Gatherings with Friends and Family: More than three times a week    Attends Religious Services: More than 4 times per year    Active Member of [employer] or Organizations: Yes    Attends Music therapist: More than 4 times per year    Marital Status: Divorced    Tobacco Counseling Counseling given: Not Answered   Clinical Intake:  Pre-visit preparation completed: No  Pain : No/denies pain      BMI - recorded: 32.06 Nutritional Status: BMI > 30  Obese Nutritional Risks: None Diabetes: No  How often do you need to have someone help you when you read instructions, pamphlets, or other written materials from your doctor or pharmacy?: 1 - Never  Diabetic?  No  Interpreter Needed?: No  Information entered by :: Rolene Arbour LPN   Activities of Daily Living    01/23/2022    2:31 PM  In your present state of health, do you have any  difficulty performing the following activities:  Hearing? 0  Vision? 0  Difficulty concentrating or making decisions? 0  Walking or climbing stairs? 0  Dressing or bathing? 0  Doing errands, shopping? 0  Preparing Food and eating ? N  Using the Toilet? N  In the past six months, have you accidently leaked urine? N  Do you have problems with loss of bowel control? N  Managing your Medications? N  Managing your Finances? N  Housekeeping or managing your Housekeeping? N    Patient Care Team: Jinny Sanders, MD as PCP - General (Family Medicine)  Indicate any recent Medical Services you may have received from other than [facility] providers in the past year (date may be approximate).     Assessment:   This is a routine wellness examination for Zandria.  Hearing/Vision screen Hearing Screening - Comments:: Denies hearing difficulties   Vision Screening - Comments:: Wears rx glasses - up to date with routine eye exams with  Dr Valetta Close  Dietary issues and exercise activities discussed: Current Exercise Habits: Home exercise routine, Type of exercise: walking, Time (Minutes): 60, Frequency (Times/Week): 5, Weekly Exercise (Minutes/Week): 300, Intensity: Moderate, Exercise limited by: None identified   Goals Addressed               This Visit's Progress     Remain active (pt-stated)        Find a part time job       Depression Screen    01/23/2022    2:30 PM 01/07/2021    9:44 AM 12/26/2019    8:19 AM 10/25/2018    3:58 PM 10/22/2017     8:50 AM 10/22/2017    8:35 AM 04/22/2017    9:07 AM  PHQ 2/9 Scores  PHQ - 2 Score 0 0 0 0 2 0 1  PHQ- 9 Score   0  7  3    Fall Risk    01/23/2022    2:32 PM 01/07/2021    9:44 AM 12/26/2019    8:18 AM 03/08/2019    9:57 AM 10/25/2018    3:58 PM  [place] in the past year? 0 0 0 0 0  Comment    Emmi Telephone Survey: data to providers prior to load   Number falls in past yr: 0  0    Injury with Fall? 0  0    Risk for fall due to : No Fall Risks  No Fall Risks    Follow up Falls prevention discussed  Falls evaluation completed;Falls prevention discussed      FALL RISK PREVENTION PERTAINING TO THE HOME:  Any stairs in or around the home? Yes  If so, are there any without handrails? No  Home free of loose throw rugs in walkways, pet beds, electrical cords, etc? Yes  Adequate lighting in your home to reduce risk of falls? Yes   ASSISTIVE DEVICES UTILIZED TO PREVENT FALLS:  Life alert? No  Use of a cane, walker or w/c? No  Grab bars in the bathroom? Yes Shower chair or bench in shower? No  Elevated toilet seat or a handicapped toilet? No   TIMED UP AND GO:  Was the test performed? No . Audio Visit  Cognitive Function:    12/26/2019    8:21 AM  MMSE - Mini Mental State Exam  Orientation to time 5  Orientation to Place 5  Registration 3  Attention/ Calculation 5  Recall 3  Language- repeat  1        01/23/2022    2:35 PM  6CIT Screen  What Year? 0 points  What month? 0 points  What time? 0 points  Count back from 20 0 points  Months in reverse 0 points  Repeat phrase 0 points  Total Score 0 points    Immunizations Immunization History  Administered Date(s) Administered   Fluad Quad(high Dose 65+) 12/28/2018, 12/28/2019   Influenza, High Dose Seasonal PF 01/15/2021, 01/12/2022   Influenza,inj,Quad PF,6+ Mos 02/09/2017, 01/19/2018   PFIZER(Purple Top)SARS-COV-2 Vaccination 07/19/2019, 08/09/2019, 04/22/2020   Pfizer Covid-19 Vaccine Bivalent  Booster 20yr & up 01/01/2021   Pneumococcal Conjugate-13 10/25/2018   Pneumococcal Polysaccharide-23 12/28/2019   Tdap 04/14/2007, 10/22/2017   Zoster Recombinat (Shingrix) 02/13/2020, 06/05/2020   Zoster, Live 09/20/2013    TDAP status: Up to date  Flu Vaccine status: Up to date  Pneumococcal vaccine status: Up to date  Covid-19 vaccine status: Completed vaccines  Qualifies for Shingles Vaccine? Yes   Zostavax completed Yes   Shingrix Completed?: Yes  Screening Tests Health Maintenance  Topic Date Due   COVID-19 Vaccine (5 - Pfizer risk series) 02/08/2022 (Originally 02/26/2021)   COLONOSCOPY (Pts 45-427yrInsurance coverage will need to be confirmed)  01/08/2023   MAMMOGRAM  06/06/2023   DEXA SCAN  06/25/2026   TETANUS/TDAP  10/23/2027   Pneumonia Vaccine 6560Years old  Completed   INFLUENZA VACCINE  Completed   Hepatitis C Screening  Completed   Zoster Vaccines- Shingrix  Completed   HPV VACCINES  Aged Out    Health Maintenance  There are no preventive care reminders to display for this patient.   Colorectal cancer screening: Type of screening: Colonoscopy. Completed 01/07/18. Repeat every 5 years  Mammogram status: Completed 06/05/21. Repeat every year  Bone Density status: Completed 06/24/21. Results reflect: Bone density results: OSTEOPOROSIS. Repeat every 5 years.  Lung Cancer Screening: (Low Dose CT Chest recommended if Age 68-80ears, 30 pack-year currently smoking OR have quit w/in 15years.) does not qualify.     Additional Screening:  Hepatitis C Screening: does qualify; Completed 61/17  Vision Screening: Recommended annual ophthalmology exams for early detection of glaucoma and other disorders of the eye. Is the patient up to date with their annual eye exam?  Yes  Who is the provider or what is the name of the office in which the patient attends annual eye exams? Dr BoValetta Closef pt is not established with a provider, would they like to be referred to a  provider to establish care? No .   Dental Screening: Recommended annual dental exams for proper oral hygiene  Community Resource Referral / Chronic Care Management:  CRR required this visit?  No   CCM required this visit?  No      Plan:     I have personally reviewed and noted the following in the patient's chart:   Medical and social history Use of alcohol, tobacco or illicit drugs  Current medications and supplements including opioid prescriptions. Patient is not currently taking opioid prescriptions. Functional ability and status Nutritional status Physical activity Advanced directives List of other physicians Hospitalizations, surgeries, and ER visits in previous 12 months Vitals Screenings to include cognitive, depression, and falls Referrals and appointments  In addition, I have reviewed and discussed with patient certain preventive protocols, quality metrics, and best practice recommendations. A written personalized care plan for preventive services as well as general preventive health recommendations were provided to patient.  Criselda Peaches, LPN   [id number]   Nurse Notes: None

## 2022-01-23 NOTE — Patient Instructions (Addendum)
Beverly Quinn , Thank you for taking time to come for your Medicare Wellness Visit. I appreciate your ongoing commitment to your health goals. Please review the following plan we discussed and let me know if I can assist you in the future.   These are the goals we discussed:  Goals       Patient Stated      12/26/2019, I will continue to go to the gym and take a spin class 3 days a week for 1 hour.       Remain active (pt-stated)      Find a part time job        This is a list of the screening recommended for you and due dates:  Health Maintenance  Topic Date Due   COVID-19 Vaccine (5 - Pfizer risk series) 02/08/2022*   Colon Cancer Screening  01/08/2023   Mammogram  06/06/2023   DEXA scan (bone density measurement)  06/25/2026   Tetanus Vaccine  10/23/2027   Pneumonia Vaccine  Completed   Flu Shot  Completed   Hepatitis C Screening: USPSTF Recommendation to screen - Ages 18-79 yo.  Completed   Zoster (Shingles) Vaccine  Completed   HPV Vaccine  Aged Out  *Topic was postponed. The date shown is not the original due date.    Advanced directives: Please bring a copy of your health care power of attorney and living will to the office to be added to your chart at your convenience.   Conditions/risks identified: None  Next appointment: Follow up in one year for your annual wellness visit     Preventive Care 65 Years and Older, Female Preventive care refers to lifestyle choices and visits with your health care provider that can promote health and wellness. What does preventive care include? A yearly physical exam. This is also called an annual well check. Dental exams once or twice a year. Routine eye exams. Ask your health care provider how often you should have your eyes checked. Personal lifestyle choices, including: Daily care of your teeth and gums. Regular physical activity. Eating a healthy diet. Avoiding tobacco and drug use. Limiting alcohol use. Practicing safe  sex. Taking low-dose aspirin every day. Taking vitamin and mineral supplements as recommended by your health care provider. What happens during an annual well check? The services and screenings done by your health care provider during your annual well check will depend on your age, overall health, lifestyle risk factors, and family history of disease. Counseling  Your health care provider may ask you questions about your: Alcohol use. Tobacco use. Drug use. Emotional well-being. Home and relationship well-being. Sexual activity. Eating habits. History of falls. Memory and ability to understand (cognition). Work and work Statistician. Reproductive health. Screening  You may have the following tests or measurements: Height, weight, and BMI. Blood pressure. Lipid and cholesterol levels. These may be checked every 5 years, or more frequently if you are over 64 years old. Skin check. Lung cancer screening. You may have this screening every year starting at age 58 if you have a 30-pack-year history of smoking and currently smoke or have quit within the past 15 years. Fecal occult blood test (FOBT) of the stool. You may have this test every year starting at age 33. Flexible sigmoidoscopy or colonoscopy. You may have a sigmoidoscopy every 5 years or a colonoscopy every 10 years starting at age 54. Hepatitis C blood test. Hepatitis B blood test. Sexually transmitted disease (STD) testing. Diabetes screening. This is  done by checking your blood sugar (glucose) after you have not eaten for a while (fasting). You may have this done every 1-3 years. Bone density scan. This is done to screen for osteoporosis. You may have this done starting at age 67. Mammogram. This may be done every 1-2 years. Talk to your health care provider about how often you should have regular mammograms. Talk with your health care provider about your test results, treatment options, and if necessary, the need for more  tests. Vaccines  Your health care provider may recommend certain vaccines, such as: Influenza vaccine. This is recommended every year. Tetanus, diphtheria, and acellular pertussis (Tdap, Td) vaccine. You may need a Td booster every 10 years. Zoster vaccine. You may need this after age 54. Pneumococcal 13-valent conjugate (PCV13) vaccine. One dose is recommended after age 39. Pneumococcal polysaccharide (PPSV23) vaccine. One dose is recommended after age 71. Talk to your health care provider about which screenings and vaccines you need and how often you need them. This information is not intended to replace advice given to you by your health care provider. Make sure you discuss any questions you have with your health care provider. Document Released: 04/26/2015 Document Revised: 12/18/2015 Document Reviewed: 01/29/2015 Elsevier Interactive Patient Education  2017 [place] Prevention in the Home Falls can cause injuries. They can happen to people of all ages. There are many things you can do to make your home safe and to help prevent falls. What can I do on the outside of my home? Regularly fix the edges of walkways and driveways and fix any cracks. Remove anything that might make you trip as you walk through a door, such as a raised step or threshold. Trim any bushes or trees on the path to your home. Use bright outdoor lighting. Clear any walking paths of anything that might make someone trip, such as rocks or tools. Regularly check to see if handrails are loose or broken. Make sure that both sides of any steps have handrails. Any raised decks and porches should have guardrails on the edges. Have any leaves, snow, or ice cleared regularly. Use sand or salt on walking paths during winter. Clean up any spills in your garage right away. This includes oil or grease spills. What can I do in the bathroom? Use night lights. Install grab bars by the toilet and in the tub and shower.  Do not use towel bars as grab bars. Use non-skid mats or decals in the tub or shower. If you need to sit down in the shower, use a plastic, non-slip stool. Keep the floor dry. Clean up any water that spills on the floor as soon as it happens. Remove soap buildup in the tub or shower regularly. Attach bath mats securely with double-sided non-slip rug tape. Do not have throw rugs and other things on the floor that can make you trip. What can I do in the bedroom? Use night lights. Make sure that you have a light by your bed that is easy to reach. Do not use any sheets or blankets that are too big for your bed. They should not hang down onto the floor. Have a firm chair that has side arms. You can use this for support while you get dressed. Do not have throw rugs and other things on the floor that can make you trip. What can I do in the kitchen? Clean up any spills right away. Avoid walking on wet floors. Keep items that you  use a lot in easy-to-reach places. If you need to reach something above you, use a strong step stool that has a grab bar. Keep electrical cords out of the way. Do not use floor polish or wax that makes floors slippery. If you must use wax, use non-skid floor wax. Do not have throw rugs and other things on the floor that can make you trip. What can I do with my stairs? Do not leave any items on the stairs. Make sure that there are handrails on both sides of the stairs and use them. Fix handrails that are broken or loose. Make sure that handrails are as long as the stairways. Check any carpeting to make sure that it is firmly attached to the stairs. Fix any carpet that is loose or worn. Avoid having throw rugs at the top or bottom of the stairs. If you do have throw rugs, attach them to the floor with carpet tape. Make sure that you have a light switch at the top of the stairs and the bottom of the stairs. If you do not have them, ask someone to add them for you. What else  can I do to help prevent falls? Wear shoes that: Do not have high heels. Have rubber bottoms. Are comfortable and fit you well. Are closed at the toe. Do not wear sandals. If you use a stepladder: Make sure that it is fully opened. Do not climb a closed stepladder. Make sure that both sides of the stepladder are locked into place. Ask someone to hold it for you, if possible. Clearly mark and make sure that you can see: Any grab bars or handrails. First and last steps. Where the edge of each step is. Use tools that help you move around (mobility aids) if they are needed. These include: Canes. Walkers. Scooters. Crutches. Turn on the lights when you go into a dark area. Replace any light bulbs as soon as they burn out. Set up your furniture so you have a clear path. Avoid moving your furniture around. If any of your floors are uneven, fix them. If there are any pets around you, be aware of where they are. Review your medicines with your doctor. Some medicines can make you feel dizzy. This can increase your chance of falling. Ask your doctor what other things that you can do to help prevent falls. This information is not intended to replace advice given to you by your health care provider. Make sure you discuss any questions you have with your health care provider. Document Released: 01/24/2009 Document Revised: 09/05/2015 Document Reviewed: 05/04/2014 Elsevier Interactive Patient Education  2017 [employer].

## 2022-01-29 ENCOUNTER — Ambulatory Visit (INDEPENDENT_AMBULATORY_CARE_PROVIDER_SITE_OTHER): Payer: Medicare HMO | Admitting: Family Medicine

## 2022-01-29 ENCOUNTER — Encounter: Payer: Self-pay | Admitting: Family Medicine

## 2022-01-29 DIAGNOSIS — I10 Essential (primary) hypertension: Secondary | ICD-10-CM

## 2022-01-29 DIAGNOSIS — E7800 Pure hypercholesterolemia, unspecified: Secondary | ICD-10-CM

## 2022-01-29 DIAGNOSIS — Z0000 Encounter for general adult medical examination without abnormal findings: Secondary | ICD-10-CM | POA: Diagnosis not present

## 2022-01-29 NOTE — Assessment & Plan Note (Signed)
Stable, chronic.  Continue current medication.  Atorvastatin 10 mg p.o. daily 

## 2022-01-29 NOTE — Assessment & Plan Note (Signed)
Stable, chronic.  Continue current medication.   Losartan 50 mg p.o. daily HCTZ 12.5 mg p.o. daily

## 2022-01-29 NOTE — Progress Notes (Signed)
Patient ID: Beverly Quinn, female    DOB: December 25, 1955, 66 y.o.   MRN: [id number]  This visit was conducted in person.  BP 122/66   Pulse 69   Temp 98.6 F (37 C)   Resp 16   Ht 5' 2.5" (1.588 m)   Wt 181 lb (82.1 kg)   LMP 04/13/2004   SpO2 97%   BMI 32.58 kg/m    CC:  Chief Complaint  Patient presents with   Medicare Wellness    Subjective:   HPI: Beverly Quinn is a 66 y.o. female presenting on 01/29/2022 for Medicare Wellness  The patient presents for annual medicare wellness, complete physical and review of chronic health problems. He/She also has the following acute concerns today:  I have personally reviewed the Medicare Annual Wellness questionnaire and have noted 1. The patient's medical and social history 2. Their use of alcohol, tobacco or illicit drugs 3. Their current medications and supplements 4. The patient's functional ability including ADL's, fall risks, home safety risks and hearing or visual             impairment. 5. Diet and physical activities 6. Evidence for depression or mood disorders 7.         Updated provider list Cognitive evaluation was performed and recorded on pt medicare questionnaire form. The patients weight, height, BMI and visual acuity have been recorded in the chart   I have made referrals, counseling and provided education to the patient based review of the above and I have provided the pt with a written personalized care plan for preventive services.   Documentation of this information was scanned into the electronic record under the media tab.   Advance directives and end of life planning reviewed in detail with patient and documented in EMR. Patient given handout on advance care directives if needed. HCPOA and living will updated if needed.  No falls in last 12 months.  Hearing  performed.  Vision; wears glassess.  Flowsheet Row Clinical Support from 01/23/2022 in [place] [facility]  PHQ-2 Total Score 0       Hypertension:   Well-controlled on losartan 50 mg daily and hydrochlorothiazide 12.5 mg. BP Readings from Last 3 Encounters:  01/29/22 122/66  05/23/21 108/62  05/17/21 125/83  Using medication without problems or lightheadedness:  none Chest pain with exertion: none Edema:none Short of breath: none Average home BPs: occ checking. Other issues:  Elevated Cholesterol: LDL at goal on atorvastatin 10 mg daily.  Using co-Q10 for side effects Lab Results  Component Value Date   CHOL 184 01/22/2022   HDL 67.70 01/22/2022   LDLCALC 99 01/22/2022   TRIG 87.0 01/22/2022   CHOLHDL 3 01/22/2022  The 10-year ASCVD risk score (Arnett DK, et al., 2019) is: 8.5%   Values used to calculate the score:     Age: 38 years     Sex: Female     Is Non-Hispanic African American: No     Diabetic: No     Tobacco smoker: No     Systolic Blood Pressure: 970 mmHg     Is BP treated: Yes     HDL Cholesterol: 67.7 mg/dL     Total Cholesterol: 184 mg/dL Using medications without problems: none Muscle aches:  none Diet compliance: heart healthy diet Exercise:3-4 days a week spinning or weights and walking. Other complaints:    Body mass index is 32.58 kg/m. Wt Readings from Last 3 Encounters:  01/29/22 181  lb (82.1 kg)  01/23/22 181 lb (82.1 kg)  05/23/21 181 lb (82.1 kg)     Relevant past medical, surgical, family and social history reviewed and updated as indicated. Interim medical history since our last visit reviewed. Allergies and medications reviewed and updated. Outpatient Medications Prior to Visit  Medication Sig Dispense Refill   atorvastatin (LIPITOR) 10 MG tablet TAKE 1 TABLET BY MOUTH EVERY DAY 90 tablet 0   Coenzyme Q10 (CO Q 10 PO) Take 1 tablet by mouth. 4 times a week     Cyanocobalamin (VITAMIN B-12 PO) Take 1 tablet by mouth daily.      fluticasone (FLONASE) 50 MCG/ACT nasal spray Place 2 sprays into both nostrils daily as needed.      hydrochlorothiazide (MICROZIDE)  12.5 MG capsule Take 1 capsule (12.5 mg total) by mouth daily. 90 capsule 3   Ibuprofen (ADVIL PO) Take by mouth as needed.     losartan (COZAAR) 50 MG tablet TAKE 1 TABLET BY MOUTH EVERY DAY 90 tablet 1   montelukast (SINGULAIR) 10 MG tablet TAKE 1 TABLET BY MOUTH EVERY DAY AS NEEDED 90 tablet 1   Multiple Vitamins-Minerals (MULTIVITAMIN PO) Take by mouth daily.     valACYclovir (VALTREX) 1000 MG tablet Take one for 3 days at onset of outbreak 90 tablet 1   VITAMIN D, ERGOCALCIFEROL, PO Take 1,000 Int'l Units by mouth daily.      No facility-administered medications prior to visit.     Per HPI unless specifically indicated in ROS section below Review of Systems  Constitutional:  Negative for fatigue and fever.  HENT:  Negative for congestion.   Eyes:  Negative for pain.  Respiratory:  Negative for cough and shortness of breath.   Cardiovascular:  Negative for chest pain, palpitations and leg swelling.  Gastrointestinal:  Negative for abdominal pain.  Genitourinary:  Negative for dysuria and vaginal bleeding.  Musculoskeletal:  Negative for back pain.  Neurological:  Negative for syncope, light-headedness and headaches.  Psychiatric/Behavioral:  Negative for dysphoric mood.    Objective:  BP 122/66   Pulse 69   Temp 98.6 F (37 C)   Resp 16   Ht 5' 2.5" (1.588 m)   Wt 181 lb (82.1 kg)   LMP 04/13/2004   SpO2 97%   BMI 32.58 kg/m   Wt Readings from Last 3 Encounters:  01/29/22 181 lb (82.1 kg)  01/23/22 181 lb (82.1 kg)  05/23/21 181 lb (82.1 kg)      Physical Exam    Results for orders placed or performed in visit on 01/22/22  Comprehensive metabolic panel  Result Value Ref Range   Sodium 139 135 - 145 mEq/L   Potassium 3.8 3.5 - 5.1 mEq/L   Chloride 102 96 - 112 mEq/L   CO2 30 19 - 32 mEq/L   Glucose, Bld 99 70 - 99 mg/dL   BUN 16 6 - 23 mg/dL   Creatinine, Ser 0.87 0.40 - 1.20 mg/dL   Total Bilirubin 0.7 0.2 - 1.2 mg/dL   Alkaline Phosphatase 69 39 - 117 U/L    AST 19 0 - 37 U/L   ALT 17 0 - 35 U/L   Total Protein 6.9 6.0 - 8.3 g/dL   Albumin 4.2 3.5 - 5.2 g/dL   GFR 68.42 >60.00 mL/min   Calcium 9.6 8.4 - 10.5 mg/dL  Lipid panel  Result Value Ref Range   Cholesterol 184 0 - 200 mg/dL   Triglycerides 87.0 0.0 - 149.0 mg/dL  HDL 67.70 >39.00 mg/dL   VLDL 17.4 0.0 - 40.0 mg/dL   LDL Cholesterol 99 0 - 99 mg/dL   Total CHOL/HDL Ratio 3    NonHDL 116.55      COVID 19 screen:  No recent travel or known exposure to COVID19 The patient denies respiratory symptoms of COVID 19 at this time. The importance of social distancing was discussed today.   Assessment and Plan   The patient's preventative maintenance and recommended screening tests for an annual wellness exam were reviewed in full today. Brought up to date unless services declined.  Counselled on the importance of diet, exercise, and its role in overall health and mortality. The patient's FH and SH was reviewed, including their home life, tobacco status, and drug and alcohol status.   Vaccines: Uptodate tdap, had shingles vaccine in 2015, uptodate PNA13/PNA23, COVID x 5 and high dose flu  uptodate  Pap/DVE:  Per GYN, 09/28/2018 Mammo:  05/2021 Bone Density: 2023 normal.repeat in 5 years, Colon:  Mother colon cancer repeat q 5 years, Buccini.. 01/07/2018 Smoking Status:none ETOH/ drug use:  couple times a month/ none  Hep C: neg  HIV screen: neg    Problem List Items Addressed This Visit     Hyperlipidemia    Stable, chronic.  Continue current medication.   Atorvastatin 10 mg p.o. daily      Hypertension    Stable, chronic.  Continue current medication.   Losartan 50 mg p.o. daily HCTZ 12.5 mg p.o. daily        Eliezer Lofts, MD

## 2022-03-02 DIAGNOSIS — D2272 Melanocytic nevi of left lower limb, including hip: Secondary | ICD-10-CM | POA: Diagnosis not present

## 2022-03-02 DIAGNOSIS — L814 Other melanin hyperpigmentation: Secondary | ICD-10-CM | POA: Diagnosis not present

## 2022-03-02 DIAGNOSIS — L308 Other specified dermatitis: Secondary | ICD-10-CM | POA: Diagnosis not present

## 2022-03-02 DIAGNOSIS — D2361 Other benign neoplasm of skin of right upper limb, including shoulder: Secondary | ICD-10-CM | POA: Diagnosis not present

## 2022-03-02 DIAGNOSIS — L821 Other seborrheic keratosis: Secondary | ICD-10-CM | POA: Diagnosis not present

## 2022-03-02 DIAGNOSIS — Z85820 Personal history of malignant melanoma of skin: Secondary | ICD-10-CM | POA: Diagnosis not present

## 2022-03-02 DIAGNOSIS — D1801 Hemangioma of skin and subcutaneous tissue: Secondary | ICD-10-CM | POA: Diagnosis not present

## 2022-03-02 DIAGNOSIS — B0089 Other herpesviral infection: Secondary | ICD-10-CM | POA: Diagnosis not present

## 2022-03-02 DIAGNOSIS — D225 Melanocytic nevi of trunk: Secondary | ICD-10-CM | POA: Diagnosis not present

## 2022-03-02 DIAGNOSIS — D485 Neoplasm of uncertain behavior of skin: Secondary | ICD-10-CM | POA: Diagnosis not present

## 2022-03-08 ENCOUNTER — Other Ambulatory Visit: Payer: Self-pay | Admitting: Family Medicine

## 2022-04-12 ENCOUNTER — Other Ambulatory Visit: Payer: Self-pay | Admitting: Family Medicine

## 2022-05-06 ENCOUNTER — Other Ambulatory Visit: Payer: Self-pay | Admitting: Family Medicine

## 2022-05-15 DIAGNOSIS — H2513 Age-related nuclear cataract, bilateral: Secondary | ICD-10-CM | POA: Diagnosis not present

## 2022-05-15 DIAGNOSIS — H1789 Other corneal scars and opacities: Secondary | ICD-10-CM | POA: Diagnosis not present

## 2022-05-21 ENCOUNTER — Other Ambulatory Visit: Payer: Self-pay | Admitting: Family Medicine

## 2022-07-01 ENCOUNTER — Other Ambulatory Visit: Payer: Self-pay | Admitting: *Deleted

## 2022-07-01 MED ORDER — LOSARTAN POTASSIUM 50 MG PO TABS: 50.0000 mg | ORAL_TABLET | Freq: Every day | ORAL | 1 refills | Status: DC

## 2022-07-01 MED ORDER — ATORVASTATIN CALCIUM 10 MG PO TABS: 10.0000 mg | ORAL_TABLET | Freq: Every day | ORAL | 1 refills | Status: DC

## 2022-08-24 ENCOUNTER — Other Ambulatory Visit: Payer: Self-pay | Admitting: Family Medicine

## 2022-08-24 DIAGNOSIS — Z1231 Encounter for screening mammogram for malignant neoplasm of breast: Secondary | ICD-10-CM

## 2022-11-12 ENCOUNTER — Ambulatory Visit: Payer: Medicare HMO

## 2022-11-26 ENCOUNTER — Ambulatory Visit
Admission: RE | Admit: 2022-11-26 | Discharge: 2022-11-26 | Disposition: A | Discharge status: Home / Self Care | Payer: Medicare HMO | Source: Ambulatory Visit | Attending: Family Medicine | Admitting: Family Medicine

## 2022-11-26 DIAGNOSIS — Z1231 Encounter for screening mammogram for malignant neoplasm of breast: Secondary | ICD-10-CM

## 2022-12-22 DIAGNOSIS — D2271 Melanocytic nevi of right lower limb, including hip: Secondary | ICD-10-CM | POA: Diagnosis not present

## 2022-12-22 DIAGNOSIS — Z85820 Personal history of malignant melanoma of skin: Secondary | ICD-10-CM | POA: Diagnosis not present

## 2022-12-22 DIAGNOSIS — D225 Melanocytic nevi of trunk: Secondary | ICD-10-CM | POA: Diagnosis not present

## 2022-12-22 DIAGNOSIS — L718 Other rosacea: Secondary | ICD-10-CM | POA: Diagnosis not present

## 2022-12-22 DIAGNOSIS — Z85828 Personal history of other malignant neoplasm of skin: Secondary | ICD-10-CM | POA: Diagnosis not present

## 2022-12-22 DIAGNOSIS — D2262 Melanocytic nevi of left upper limb, including shoulder: Secondary | ICD-10-CM | POA: Diagnosis not present

## 2022-12-22 DIAGNOSIS — D2272 Melanocytic nevi of left lower limb, including hip: Secondary | ICD-10-CM | POA: Diagnosis not present

## 2022-12-22 DIAGNOSIS — X32XXXA Exposure to sunlight, initial encounter: Secondary | ICD-10-CM | POA: Diagnosis not present

## 2022-12-22 DIAGNOSIS — D2261 Melanocytic nevi of right upper limb, including shoulder: Secondary | ICD-10-CM | POA: Diagnosis not present

## 2022-12-22 DIAGNOSIS — L578 Other skin changes due to chronic exposure to nonionizing radiation: Secondary | ICD-10-CM | POA: Diagnosis not present

## 2023-01-08 ENCOUNTER — Encounter: Payer: Self-pay | Admitting: Family Medicine

## 2023-01-08 ENCOUNTER — Ambulatory Visit (INDEPENDENT_AMBULATORY_CARE_PROVIDER_SITE_OTHER): Payer: Medicare HMO | Admitting: Family Medicine

## 2023-01-08 VITALS — BP 182/82 | HR 68 | Ht 63.5 in | Wt 187.0 lb

## 2023-01-08 DIAGNOSIS — Z01419 Encounter for gynecological examination (general) (routine) without abnormal findings: Secondary | ICD-10-CM

## 2023-01-08 NOTE — Progress Notes (Unsigned)
Patient presents for Annual.  LMP: post menopausal  Last pap: Date: 01/05/20 Contraception: Post-menopausal Mammogram: Up to date: 11/26/22 STD Screening: not indicated Flu Vaccine :  Already has gotten Flu/Covid vaccines   CC: Annual

## 2023-01-08 NOTE — Progress Notes (Unsigned)
   GYNECOLOGY ANNUAL PREVENTATIVE CARE ENCOUNTER NOTE  Subjective:   Beverly Quinn is a 67 y.o. G62P0010 female here for a routine annual gynecologic exam.  Current complaints: None- establishing care.     Denies abnormal vaginal bleeding, discharge, pelvic pain, problems with intercourse or other gynecologic concerns.    Gynecologic History Patient's last menstrual period was 04/13/2004. Contraception: post menopausal status Last Pap: 2021. Results were: normal-- Was >65 at the time Last mammogram: 2024  Health Maintenance Due  Topic Date Due   Colonoscopy  01/08/2023   Medicare Annual Wellness (AWV)  01/30/2023    The following portions of the patient's history were reviewed and updated as appropriate: allergies, current medications, past family history, past medical history, past social history, past surgical history and problem list.  Review of Systems Pertinent items are noted in HPI.   Objective:  BP (!) 182/82   Pulse 68   Ht 5' 3.5" (1.613 m)   Wt 187 lb (84.8 kg)   LMP 04/13/2004   BMI 32.61 kg/m  CONSTITUTIONAL: Well-developed, well-nourished female in no acute distress.  HENT:  Normocephalic, atraumatic, External right and left ear normal. Oropharynx is clear and moist EYES:  No scleral icterus.  NECK: Normal range of motion, supple, no masses.  Normal thyroid.  SKIN: Skin is warm and dry. No rash noted. Not diaphoretic. No erythema. No pallor. NEUROLOGIC: Alert and oriented to person, place, and time. Normal reflexes, muscle tone coordination. No cranial nerve deficit noted. PSYCHIATRIC: Normal mood and affect. Normal behavior. Normal judgment and thought content. CARDIOVASCULAR: Normal heart rate noted, regular rhythm. 2+ distal pulses. RESPIRATORY: Effort and breath sounds normal, no problems with respiration noted. BREASTS: Symmetric in size. No masses, skin changes, nipple drainage, or lymphadenopathy. ABDOMEN: Soft,  no distention noted.  No tenderness,  rebound or guarding.  PELVIC: Normal appearing external genitalia; Mildly atrophic vaginal mucosa and cervix. No abnormal discharge noted.   Normal uterine size, no other palpable masses, no uterine or adnexal tenderness. Chaperone present for exam MUSCULOSKELETAL: Normal range of motion.    Assessment and Plan:  1) Annual gynecologic examination: Routine preventative health maintenance measures emphasized. Reviewed postmenopausal symptoms and management.  Reviewed no need for future pap smears. Discussed PRN GYN care.   Please refer to After Visit Summary for other counseling recommendations.   No follow-ups on file.  Federico Flake, MD, MPH, ABFM Attending Physician Center for [facility]

## 2023-01-20 ENCOUNTER — Other Ambulatory Visit: Payer: Self-pay | Admitting: Family Medicine

## 2023-03-09 ENCOUNTER — Telehealth: Payer: Self-pay | Admitting: *Deleted

## 2023-03-09 DIAGNOSIS — E7800 Pure hypercholesterolemia, unspecified: Secondary | ICD-10-CM

## 2023-03-09 NOTE — Telephone Encounter (Signed)
-----   Message from Alvina Chou sent at 03/09/2023  3:37 PM EST ----- Regarding: Lab orders for Fri, 12.13.24 Patient is scheduled for CPX labs, please order future labs, Thanks , Camelia Eng

## 2023-03-24 ENCOUNTER — Ambulatory Visit (INDEPENDENT_AMBULATORY_CARE_PROVIDER_SITE_OTHER): Payer: Medicare HMO | Admitting: General Practice

## 2023-03-24 ENCOUNTER — Encounter: Payer: Self-pay | Admitting: General Practice

## 2023-03-24 VITALS — BP 136/88 | HR 94 | Temp 100.1°F | Ht 63.5 in | Wt 189.0 lb

## 2023-03-24 DIAGNOSIS — R051 Acute cough: Secondary | ICD-10-CM | POA: Diagnosis not present

## 2023-03-24 DIAGNOSIS — E7800 Pure hypercholesterolemia, unspecified: Secondary | ICD-10-CM | POA: Diagnosis not present

## 2023-03-24 LAB — COMPREHENSIVE METABOLIC PANEL
ALT: 19 U/L (ref 0–35)
AST: 20 U/L (ref 0–37)
Albumin: 4.3 g/dL (ref 3.5–5.2)
Alkaline Phosphatase: 90 U/L (ref 39–117)
BUN: 13 mg/dL (ref 6–23)
CO2: 31 meq/L (ref 19–32)
Calcium: 9.5 mg/dL (ref 8.4–10.5)
Chloride: 99 meq/L (ref 96–112)
Creatinine, Ser: 0.89 mg/dL (ref 0.40–1.20)
GFR: 66.03 mL/min (ref >60.00)
Glucose, Bld: 99 mg/dL (ref 70–99)
Potassium: 4 meq/L (ref 3.5–5.1)
Sodium: 139 meq/L (ref 135–145)
Total Bilirubin: 0.5 mg/dL (ref 0.2–1.2)
Total Protein: 7.2 g/dL (ref 6.0–8.3)

## 2023-03-24 LAB — LIPID PANEL
Cholesterol: 181 mg/dL (ref 0–200)
HDL: 68.5 mg/dL (ref >39.00)
LDL Cholesterol: 94 mg/dL (ref 0–99)
NonHDL: 112.83
Total CHOL/HDL Ratio: 3
Triglycerides: 95 mg/dL (ref 0.0–149.0)
VLDL: 19 mg/dL (ref 0.0–40.0)

## 2023-03-24 MED ORDER — PREDNISONE 20 MG PO TABS: 40.0000 mg | ORAL_TABLET | Freq: Every day | ORAL | 0 refills | Status: AC

## 2023-03-24 MED ORDER — PROMETHAZINE-DM 6.25-15 MG/5ML PO SYRP: 5.0000 mL | ORAL_SOLUTION | Freq: Four times a day (QID) | ORAL | 0 refills | Status: AC | PRN

## 2023-03-24 MED ORDER — AZITHROMYCIN 250 MG PO TABS: ORAL_TABLET | ORAL | 0 refills | Status: AC

## 2023-03-24 MED ORDER — BENZONATATE 200 MG PO CAPS: 200.0000 mg | ORAL_CAPSULE | Freq: Three times a day (TID) | ORAL | 0 refills | Status: DC | PRN

## 2023-03-24 NOTE — Assessment & Plan Note (Signed)
Given patient's presentation today and persistent fevers, will go ahead and treat.   Start Azithromycin antibiotics for infection. Take 2 tablets by mouth today, then 1 tablet daily for 4 additional days.  Start Benzonatate capsules for cough. Take 1 capsule by mouth three times daily as needed for cough.  Start prednisone 20 mg tablets. Take 2 tablets by mouth once daily in the morning for 5 days.  Start promethazine-dextromethorphan 6.25-15 mg/ 5ml syrup as needed for cough. Discussed with patient that this can cause drowsiness. Verbalizes understanding.   Consider chest x-ray if not better or worse. She will update if her symptoms do not improve or worsen.

## 2023-03-24 NOTE — Patient Instructions (Signed)
Start Azithromycin antibiotics for infection. Take 2 tablets by mouth today, then 1 tablet daily for 4 additional days.  You may take Benzonatate capsules for cough. Take 1 capsule by mouth three times daily as needed for cough.  You can try a few things over the counter to help with your symptoms including:  Cough: Delsym or Robitussin (get the off brand, works just as well) Chest Congestion: Mucinex (plain) Nasal Congestion/Ear Pressure/Sinus Pressure: Try using Flonase (fluticasone) nasal spray. Instill 1 spray in each nostril twice daily. This can be purchased over the counter. Body aches, fevers, headache: Ibuprofen (not to exceed 2400 mg in 24 hours) or Acetaminophen-Tylenol (not to exceed 3000 mg in 24 hours) Runny Nose/Throat Drainage/Sneezing/Itchy or Watery Eyes: An antihistamine such as Zyrtec, Claritin, Xyzal, Allegra  Please update if your symptoms worse or failed to improve.

## 2023-03-24 NOTE — Progress Notes (Signed)
Established Patient Office Visit  Subjective   Patient ID: Beverly Quinn, female    DOB: 1955-06-10  Age: 67 y.o. MRN: [id number]  Chief Complaint  Patient presents with   Cough    Runny nose on Saturday; Monday patient got sore throat, sneezing, headaches and cough worsened. Today patient has some wheezing in her chest. Patient has done two covid tests and both negative. Patient has been taking mucinex D and took tylenol for her headache last night.     Cough Associated symptoms include chills, a fever, headaches, a sore throat and wheezing. Pertinent negatives include no chest pain, ear pain or shortness of breath.    Beverly Quinn is a 67 year old female, patient of Dr. Ermalene Quinn, presents today for an acute visit.   Symptom onset- 03/20/23. Started with runny nose. Progressed to sore throat, sneezing, headaches and cough. Now she has developed wheezing. She checked her fever Monday and it was 99.1. She has been more colder than usual. Her worst symptom is the productive cough with clear sputum. It is keeping her awake at night. Has done two covid tests at home, Monday and Tuesday, which were both negative. She has tried mucinex d (Sunday and Monday) and tylenol. She did get some relief from mucinex. She states that she is feeling same or maybe a little worse. She does feel like she has a upset stomach but no diarrhea or vomiting. She is up to date on her pneumonia vaccine, flu shot and covid booster this season. No history of asthma or smoking.   Patient Active Problem List   Diagnosis Date Noted   Acute cough 03/24/2023   Allergic rhinitis 04/22/2017   GAD (generalized anxiety disorder) 04/22/2017   Hypertension    History of IBS    Fibroid    Hyperlipidemia    History of melanoma    Difficult intubation    Past Medical History:  Diagnosis Date   Abnormal Pap smear of cervix    Cancer (HCC)    melanoma lower left leg, left arm   Difficult intubation    took 3 tries with nerve  block   Fibroid    5 cm   Hyperlipidemia    Hypertension    IBS (irritable bowel syndrome)    Shingles    Past Surgical History:  Procedure Laterality Date   CARPAL TUNNEL RELEASE  2004   left & right   CERVICAL BIOPSY  W/ LOOP ELECTRODE EXCISION     CIN1   COLPOSCOPY     VAIN1   melanoma removal  05/2016   left arm   ROTATOR CUFF REPAIR Right 2012   TONSILLECTOMY     TUBAL LIGATION     Allergies  Allergen Reactions   Doxycycline Other (See Comments)    dizziness   Hydrocodone     Flu like symptoms         12 /02/2023   11:52 AM 01/08/2023    9:39 AM 01/23/2022    2:30 PM  Depression screen PHQ 2/9  Decreased Interest 0 0 0  Down, Depressed, Hopeless 0 0 0  PHQ - 2 Score 0 0 0  Altered sleeping 0 0   Tired, decreased energy 0 0   Change in appetite 0 0   Feeling bad or failure about yourself  0 0   Trouble concentrating 0 0   Moving slowly or fidgety/restless 0 0   Suicidal thoughts 0 0   PHQ-9 Score 0 0  Difficult doing work/chores Not difficult at all Not difficult at all        01/08/2023    9:39 AM 10/22/2017    8:48 AM 04/22/2017    9:09 AM  GAD 7 : Generalized Anxiety Score  Nervous, Anxious, on Edge 1 0 2  Control/stop worrying 0 1 2  Worry too much - different things 0 1 2  Trouble relaxing 0 0 0  Restless 0 0 0  Easily annoyed or irritable 0 3 1  Afraid - awful might happen 0 0 0  Total GAD 7 Score 1 5 7   Anxiety Difficulty Not difficult at all  Somewhat difficult      Review of Systems  Constitutional:  Positive for chills and fever.  HENT:  Positive for congestion and sore throat. Negative for ear pain and sinus pain.   Eyes:  Negative for blurred vision and pain.  Respiratory:  Positive for cough, sputum production and wheezing. Negative for shortness of breath.   Cardiovascular:  Negative for chest pain.  Gastrointestinal:  Positive for nausea. Negative for constipation and diarrhea.  Neurological:  Positive for headaches. Negative  for dizziness.  Psychiatric/Behavioral:  Negative for depression. The patient is not nervous/anxious.       Objective:     BP 136/88 (BP Location: Left Arm, Patient Position: Sitting, Cuff Size: Normal)   Pulse 94   Temp 100.1 F (37.8 C) (Oral)   Ht 5' 3.5" (1.613 m)   Wt 189 lb (85.7 kg)   LMP 04/13/2004   SpO2 98%   BMI 32.95 kg/m  BP Readings from Last 3 Encounters:  03/24/23 136/88  01/08/23 (!) 182/82  01/29/22 122/66   Wt Readings from Last 3 Encounters:  03/24/23 189 lb (85.7 kg)  01/08/23 187 lb (84.8 kg)  01/29/22 181 lb (82.1 kg)      Physical Exam Vitals and nursing note reviewed.  Constitutional:      Appearance: Normal appearance.  HENT:     Right Ear: Tympanic membrane, ear canal and external ear normal.     Left Ear: Tympanic membrane, ear canal and external ear normal.     Nose: Congestion present.     Mouth/Throat:     Mouth: Mucous membranes are moist.     Pharynx: Oropharynx is clear.  Eyes:     Conjunctiva/sclera: Conjunctivae normal.  Cardiovascular:     Rate and Rhythm: Normal rate and regular rhythm.     Pulses: Normal pulses.     Heart sounds: Normal heart sounds.  Pulmonary:     Effort: Pulmonary effort is normal.     Breath sounds: Normal breath sounds.  Abdominal:     General: Bowel sounds are normal.  Skin:    General: Skin is warm.  Neurological:     Mental Status: She is alert and oriented to person, place, and time.  Psychiatric:        Mood and Affect: Mood normal.        Behavior: Behavior normal.        Thought Content: Thought content normal.        Judgment: Judgment normal.      No results found for any visits on 03/24/23.     The 10-year ASCVD risk score (Arnett DK, et al., 2019) is: 11.8%    Assessment & Plan:  Acute cough Assessment & Plan: Given patient's presentation today and persistent fevers, will go ahead and treat.   Start Azithromycin antibiotics for infection. Take  2 tablets by mouth today,  then 1 tablet daily for 4 additional days.  Start Benzonatate capsules for cough. Take 1 capsule by mouth three times daily as needed for cough.  Start prednisone 20 mg tablets. Take 2 tablets by mouth once daily in the morning for 5 days.  Start promethazine-dextromethorphan 6.25-15 mg/ 5ml syrup as needed for cough. Discussed with patient that this can cause drowsiness. Verbalizes understanding.   Consider chest x-ray if not better or worse. She will update if her symptoms do not improve or worsen.   Orders: -     Azithromycin; Take 2 tablets on day 1, then 1 tablet daily on days 2 through 5  Dispense: 6 tablet; Refill: 0 -     Benzonatate; Take 1 capsule (200 mg total) by mouth 3 (three) times daily as needed for cough.  Dispense: 20 capsule; Refill: 0 -     Promethazine-DM; Take 5 mLs by mouth 4 (four) times daily as needed for up to 5 days for cough.  Dispense: 100 mL; Refill: 0 -     predniSONE; Take 2 tablets (40 mg total) by mouth daily for 5 days.  Dispense: 10 tablet; Refill: 0  Pure hypercholesterolemia -     Lipid panel -     Comprehensive metabolic panel     Return if symptoms worsen or fail to improve.    Modesto Charon, NP

## 2023-03-25 NOTE — Progress Notes (Signed)
No critical labs need to be addressed urgently. We will discuss labs in detail at upcoming office visit.   

## 2023-03-26 ENCOUNTER — Other Ambulatory Visit: Payer: Medicare HMO

## 2023-03-28 ENCOUNTER — Encounter: Payer: Self-pay | Admitting: *Deleted

## 2023-03-28 ENCOUNTER — Ambulatory Visit: Payer: Medicare HMO

## 2023-03-28 ENCOUNTER — Ambulatory Visit
Admission: EM | Admit: 2023-03-28 | Discharge: 2023-03-28 | Disposition: A | Discharge status: Home / Self Care | Payer: Medicare HMO | Attending: Emergency Medicine | Admitting: Emergency Medicine

## 2023-03-28 DIAGNOSIS — J069 Acute upper respiratory infection, unspecified: Secondary | ICD-10-CM

## 2023-03-28 DIAGNOSIS — R058 Other specified cough: Secondary | ICD-10-CM

## 2023-03-28 DIAGNOSIS — R059 Cough, unspecified: Secondary | ICD-10-CM | POA: Diagnosis not present

## 2023-03-28 MED ORDER — AEROCHAMBER PLUS FLO-VU MEDIUM MISC
1.0000 | Freq: Once | Status: AC
  Administered 2023-03-28: 1

## 2023-03-28 MED ORDER — ALBUTEROL SULFATE HFA 108 (90 BASE) MCG/ACT IN AERS
2.0000 | INHALATION_SPRAY | Freq: Once | RESPIRATORY_TRACT | Status: AC
  Administered 2023-03-28: 2 via RESPIRATORY_TRACT

## 2023-03-28 NOTE — ED Triage Notes (Signed)
Patient states URI symptoms for over 1 week, saw Dr on Beverly Quinn and is on Abx, steroids, but states she feels no better.

## 2023-03-28 NOTE — ED Provider Notes (Signed)
Renaldo Fiddler    CSN: [id number] Arrival date & time: 03/28/23  [id number]      History   Chief Complaint Chief Complaint  Patient presents with   Cough    HPI Beverly Quinn is a 67 y.o. female.  Patient presents with 1 week history of headache, sore throat, congestion, runny nose, cough.  Her cough became acutely worse last night.  She is currently on Zithromax and prednisone.  She had negative COVID test at home x 2.  No fever or shortness of breath.  Patient was seen by her PCP on 03/24/2023; diagnosed with cough; treated with Zithromax, prednisone, Tessalon Perles.  Her medical history includes hypertension, hyperlipidemia, IBS, allergic rhinitis, anxiety.  The history is provided by the patient and medical records.    Past Medical History:  Diagnosis Date   Abnormal Pap smear of cervix    Cancer (HCC)    melanoma lower left leg, left arm   Difficult intubation    took 3 tries with nerve block   Fibroid    5 cm   Hyperlipidemia    Hypertension    IBS (irritable bowel syndrome)    Shingles     Patient Active Problem List   Diagnosis Date Noted   Acute cough 03/24/2023   Allergic rhinitis 04/22/2017   GAD (generalized anxiety disorder) 04/22/2017   Hypertension    History of IBS    Fibroid    Hyperlipidemia    History of melanoma    Difficult intubation     Past Surgical History:  Procedure Laterality Date   CARPAL TUNNEL RELEASE  2004   left & right   CERVICAL BIOPSY  W/ LOOP ELECTRODE EXCISION     CIN1   COLPOSCOPY     VAIN1   melanoma removal  05/2016   left arm   ROTATOR CUFF REPAIR Right 2012   TONSILLECTOMY     TUBAL LIGATION      OB History     Gravida  1   Para      Term      Preterm      AB  1   Living  0      SAB      IAB      Ectopic      Multiple      Live Births               Home Medications    Prior to Admission medications   Medication Sig Start Date End Date Taking? Authorizing Provider   atorvastatin (LIPITOR) 10 MG tablet Take 1 tablet (10 mg total) by mouth daily. 07/01/22   Excell Seltzer, MD  azithromycin (ZITHROMAX) 250 MG tablet Take 2 tablets on day 1, then 1 tablet daily on days 2 through 5 03/24/23 03/29/23  Modesto Charon, NP  benzonatate (TESSALON) 200 MG capsule Take 1 capsule (200 mg total) by mouth 3 (three) times daily as needed for cough. 03/24/23   Modesto Charon, NP  Coenzyme Q10 (CO Q 10 PO) Take 1 tablet by mouth. 4 times a week    [provider]  Cyanocobalamin (VITAMIN B-12 PO) Take 1 tablet by mouth daily.    [provider]  fluticasone (FLONASE) 50 MCG/ACT nasal spray Place 2 sprays into both nostrils daily as needed.  08/22/13   [provider]  hydrochlorothiazide (MICROZIDE) 12.5 MG capsule TAKE 1 CAPSULE BY MOUTH EVERY DAY 05/21/22   Excell Seltzer, MD  Ibuprofen (ADVIL PO) Take by mouth as needed.    [provider]  losartan (COZAAR) 50 MG tablet TAKE 1 TABLET BY MOUTH EVERY DAY 01/21/23   Bedsole, Amy E, MD  montelukast (SINGULAIR) 10 MG tablet TAKE 1 TABLET BY MOUTH EVERY DAY AS NEEDED 03/08/22   Bedsole, Amy E, MD  Multiple Vitamins-Minerals (MULTIVITAMIN PO) Take by mouth daily.    [provider]  predniSONE (DELTASONE) 20 MG tablet Take 2 tablets (40 mg total) by mouth daily for 5 days. 03/24/23 03/29/23  Modesto Charon, NP  promethazine-dextromethorphan (PROMETHAZINE-DM) 6.25-15 MG/5ML syrup Take 5 mLs by mouth 4 (four) times daily as needed for up to 5 days for cough. 03/24/23 03/29/23  Modesto Charon, NP  valACYclovir (VALTREX) 1000 MG tablet Take one for 3 days at onset of outbreak 03/08/18   Excell Seltzer, MD  VITAMIN D, ERGOCALCIFEROL, PO Take 1,000 Int'l Units by mouth daily.     [provider]    Family History Family History  Problem Relation Age of Onset   Cancer Mother        uterine & colon   Hypertension Mother    Cancer Sister        lung   Hypertension Sister     Social  History Social History   Tobacco Use   Smoking status: Never   Smokeless tobacco: Never  Substance Use Topics   Alcohol use: Yes    Alcohol/week: 2.0 standard drinks of alcohol    Types: 2 Shots of liquor per week   Drug use: No     Allergies   Doxycycline and Hydrocodone   Review of Systems Review of Systems  Constitutional:  Negative for chills and fever.  HENT:  Positive for congestion, postnasal drip and sore throat. Negative for ear pain.   Respiratory:  Positive for cough. Negative for shortness of breath.   Neurological:  Positive for headaches.     Physical Exam Triage Vital Signs ED Triage Vitals  Encounter Vitals Group     BP --      Systolic BP Percentile --      Diastolic BP Percentile --      Pulse Rate 03/28/23 0837 89     Resp 03/28/23 0837 18     Temp 03/28/23 0837 98 F (36.7 C)     Temp src --      SpO2 03/28/23 0837 95 %     Weight 03/28/23 0844 185 lb (83.9 kg)     Height 03/28/23 0844 5' 3.5" (1.613 m)     Head Circumference --      Peak Flow --      Pain Score 03/28/23 0844 0     Pain Loc --      Pain Education --      Exclude from Growth Chart --    No data found.  Updated Vital Signs BP (!) 141/84 Comment: checked twice, hasn't taken meds  Pulse 89   Temp 98 F (36.7 C)   Resp 18   Ht 5' 3.5" (1.613 m)   Wt 185 lb (83.9 kg)   LMP 04/13/2004   SpO2 95%   BMI 32.26 kg/m   Visual Acuity Right Eye Distance:   Left Eye Distance:   Bilateral Distance:    Right Eye Near:   Left Eye Near:    Bilateral Near:     Physical Exam Constitutional:      General: She is not in acute distress. HENT:  Right Ear: Tympanic membrane normal.     Left Ear: Tympanic membrane normal.     Nose: Nose normal.     Mouth/Throat:     Mouth: Mucous membranes are moist.     Pharynx: Oropharynx is clear.     Comments: Clear PND. Cardiovascular:     Rate and Rhythm: Normal rate and regular rhythm.     Heart sounds: Normal heart sounds.   Pulmonary:     Effort: Pulmonary effort is normal. No respiratory distress.     Breath sounds: Normal breath sounds.     Comments: Frequent wet-sounding cough. Skin:    General: Skin is warm and dry.  Neurological:     Mental Status: She is alert.      UC Treatments / Results  Labs (all labs ordered are listed, but only abnormal results are displayed) Labs Reviewed - No data to display  EKG   Radiology DG Chest 2 View Result Date: 03/28/2023 CLINICAL DATA:  Cough for 1 week. EXAM: CHEST - 2 VIEW COMPARISON:  05/17/2021 FINDINGS: The heart size and mediastinal contours are within normal limits. Both lungs are clear. Stable nodular density overlying the left lung base on the PA view, which corresponds to prominent anterior costochondral cartilage calcification in the left anterior chest wall. IMPRESSION: No active cardiopulmonary disease. Electronically Signed   By: Danae Orleans M.D.   On: 03/28/2023 09:27    Procedures Procedures (including critical care time)  Medications Ordered in UC Medications  albuterol (VENTOLIN HFA) 108 (90 Base) MCG/ACT inhaler 2 puff (2 puffs Inhalation Given 03/28/23 0911)  AeroChamber Plus Flo-Vu Medium MISC 1 each (1 each Other Given 03/28/23 0911)    Initial Impression / Assessment and Plan / UC Course  I have reviewed the triage vital signs and the nursing notes.  Pertinent labs & imaging results that were available during my care of the patient were reviewed by me and considered in my medical decision making (see chart for details).   Productive cough, acute upper respiratory infection.  CXR negative.  Albuterol inhaler with spacer given here.  Patient is currently on prednisone and Zithromax.  Discussed chest x-ray results and treatment plan with patient.  Education provided on cough and bronchitis.  Instructed her to follow-up with her PCP tomorrow.  ED precautions given.  She agrees to plan of care.  Final Clinical Impressions(s) / UC  Diagnoses   Final diagnoses:  Productive cough  Acute upper respiratory infection     Discharge Instructions      Follow up with your primary care provider tomorrow.  Go to the emergency department if you have worsening symptoms.    Use the albuterol inhaler as directed (2 puffs every 4-6 hours as needed for cough or wheezing).    Continue the prednisone and Zithromax as directed.    Your chest x-ray is pending.     ED Prescriptions   None    PDMP not reviewed this encounter.   Mickie Bail, NP 03/28/23 [phone]

## 2023-03-28 NOTE — Discharge Instructions (Addendum)
Follow up with your primary care provider tomorrow.  Go to the emergency department if you have worsening symptoms.    Use the albuterol inhaler as directed (2 puffs every 4-6 hours as needed for cough or wheezing).    Continue the prednisone and Zithromax as directed.    Your chest x-ray is pending.

## 2023-03-30 ENCOUNTER — Ambulatory Visit (INDEPENDENT_AMBULATORY_CARE_PROVIDER_SITE_OTHER): Payer: Medicare HMO | Admitting: Family Medicine

## 2023-03-30 VITALS — BP 120/64 | HR 79 | Temp 98.3°F | Ht 63.5 in | Wt 186.2 lb

## 2023-03-30 DIAGNOSIS — R051 Acute cough: Secondary | ICD-10-CM | POA: Diagnosis not present

## 2023-03-30 MED ORDER — PREDNISONE 20 MG PO TABS: ORAL_TABLET | ORAL | 0 refills | Status: DC

## 2023-03-30 NOTE — Assessment & Plan Note (Signed)
Acute, most likely viral upper respiratory tract infection plus or minus bacterial infection that is now resolved.  Viral infection possibly secondary to RSV given significant amount of bronchospasm and wheeze associated. She has had significant improvement with initiation of prednisone and albuterol.  Using cough suppressant at night to rest.  Encouraged continued p.o. intake, rest and will continue prednisone taper for another 5 days. Can use albuterol as needed.  Start Mucinex DM during the day and continue Promethazine DM cough syrup at night.  Return and ER precautions provided

## 2023-03-30 NOTE — Progress Notes (Signed)
Patient ID: PEARL CLAMP, female    DOB: 1955/06/07, 67 y.o.   MRN: [id number]  This visit was conducted in person.  BP 120/64 (BP Location: Left Arm, Patient Position: Sitting, Cuff Size: Large)   Pulse 79   Temp 98.3 F (36.8 C) (Temporal)   Ht 5' 3.5" (1.613 m)   Wt 186 lb 4 oz (84.5 kg)   LMP 04/13/2004   SpO2 95%   BMI 32.48 kg/m    CC:  Chief Complaint  Patient presents with   Follow-up    ER Follow Up-URI    Subjective:   HPI: Beverly Quinn is a 67 y.o. female presenting on 03/30/2023 for Follow-up (ER Follow Up-URI)  Started  feeling bad 12/6, worsened.  Chills and low grade fever. Initially seen December 11 by  Modesto Charon, NP.  Started on Zithromax and prednisone Negative COVID test x 2  Reviewed urgent care note from December 15 Seen with 1 week history of headache, sore throat, congestion and cough., Not improving with azithromycin and prednisone.  Coughing fits Chest x-ray showed no cardiopulmonary disease Treated with albuterol inhaler with spacer.  Recommended continuing prednisone and Zithromax.  Today she reports her cough has continued but now white mucus, improving with albutreol.  Has gradually started improving day by day, has turned a corner.   Completed antibiotics, one more day of prednisone.  No recent fever.  No ear pain, no face pain.  No SOB.     Promethezine DM helping with cough.  Relevant past medical, surgical, family and social history reviewed and updated as indicated. Interim medical history since our last visit reviewed. Allergies and medications reviewed and updated. Outpatient Medications Prior to Visit  Medication Sig Dispense Refill   atorvastatin (LIPITOR) 10 MG tablet Take 1 tablet (10 mg total) by mouth daily. 100 tablet 1   benzonatate (TESSALON) 200 MG capsule Take 1 capsule (200 mg total) by mouth 3 (three) times daily as needed for cough. 20 capsule 0   Coenzyme Q10 (CO Q 10 PO) Take 1 tablet by mouth. 4 times a  week     Cyanocobalamin (VITAMIN B-12 PO) Take 1 tablet by mouth daily.     fluticasone (FLONASE) 50 MCG/ACT nasal spray Place 2 sprays into both nostrils daily as needed.      hydrochlorothiazide (MICROZIDE) 12.5 MG capsule TAKE 1 CAPSULE BY MOUTH EVERY DAY 90 capsule 3   Ibuprofen (ADVIL PO) Take by mouth as needed.     losartan (COZAAR) 50 MG tablet TAKE 1 TABLET BY MOUTH EVERY DAY 90 tablet 0   montelukast (SINGULAIR) 10 MG tablet TAKE 1 TABLET BY MOUTH EVERY DAY AS NEEDED 90 tablet 3   Multiple Vitamins-Minerals (MULTIVITAMIN PO) Take by mouth daily.     valACYclovir (VALTREX) 1000 MG tablet Take one for 3 days at onset of outbreak 90 tablet 1   VITAMIN D, ERGOCALCIFEROL, PO Take 1,000 Int'l Units by mouth daily.      No facility-administered medications prior to visit.     Per HPI unless specifically indicated in ROS section below Review of Systems  Constitutional:  Negative for fatigue and fever.  HENT:  Negative for congestion.   Eyes:  Negative for pain.  Respiratory:  Negative for cough and shortness of breath.   Cardiovascular:  Negative for chest pain, palpitations and leg swelling.  Gastrointestinal:  Negative for abdominal pain.  Genitourinary:  Negative for dysuria and vaginal bleeding.  Musculoskeletal:  Negative for back  pain.  Neurological:  Negative for syncope, light-headedness and headaches.  Psychiatric/Behavioral:  Negative for dysphoric mood.    Objective:  BP 120/64 (BP Location: Left Arm, Patient Position: Sitting, Cuff Size: Large)   Pulse 79   Temp 98.3 F (36.8 C) (Temporal)   Ht 5' 3.5" (1.613 m)   Wt 186 lb 4 oz (84.5 kg)   LMP 04/13/2004   SpO2 95%   BMI 32.48 kg/m   Wt Readings from Last 3 Encounters:  03/30/23 186 lb 4 oz (84.5 kg)  03/28/23 185 lb (83.9 kg)  03/24/23 189 lb (85.7 kg)      Physical Exam Constitutional:      General: She is not in acute distress.    Appearance: Normal appearance. She is well-developed. She is not  ill-appearing or toxic-appearing.  HENT:     Head: Normocephalic.     Right Ear: Hearing, tympanic membrane, ear canal and external ear normal. Tympanic membrane is not erythematous, retracted or bulging.     Left Ear: Hearing, tympanic membrane, ear canal and external ear normal. Tympanic membrane is not erythematous, retracted or bulging.     Nose: No mucosal edema or rhinorrhea.     Right Sinus: No maxillary sinus tenderness or frontal sinus tenderness.     Left Sinus: No maxillary sinus tenderness or frontal sinus tenderness.     Mouth/Throat:     Pharynx: Uvula midline.  Eyes:     General: Lids are normal. Lids are everted, no foreign bodies appreciated.     Conjunctiva/sclera: Conjunctivae normal.     Pupils: Pupils are equal, round, and reactive to light.  Neck:     Thyroid: No thyroid mass or thyromegaly.     Vascular: No carotid bruit.     Trachea: Trachea normal.  Cardiovascular:     Rate and Rhythm: Normal rate and regular rhythm.     Pulses: Normal pulses.     Heart sounds: Normal heart sounds, S1 normal and S2 normal. No murmur heard.    No friction rub. No gallop.  Pulmonary:     Effort: Pulmonary effort is normal. No tachypnea or respiratory distress.     Breath sounds: Wheezing present. No decreased breath sounds, rhonchi or rales.     Comments:  Scattered wheeze Abdominal:     General: Bowel sounds are normal.     Palpations: Abdomen is soft.     Tenderness: There is no abdominal tenderness.  Musculoskeletal:     Cervical back: Normal range of motion and neck supple.  Skin:    General: Skin is warm and dry.     Findings: No rash.  Neurological:     Mental Status: She is alert.  Psychiatric:        Mood and Affect: Mood is not anxious or depressed.        Speech: Speech normal.        Behavior: Behavior normal. Behavior is cooperative.        Thought Content: Thought content normal.        Judgment: Judgment normal.       Results for orders placed or  performed in visit on 03/24/23  Lipid panel   Collection Time: 03/24/23 12:41 PM  Result Value Ref Range   Cholesterol 181 0 - 200 mg/dL   Triglycerides 11.9 0.0 - 149.0 mg/dL   HDL 14.78 >29.56 mg/dL   VLDL 21.3 0.0 - 08.6 mg/dL   LDL Cholesterol 94 0 - 99 mg/dL  Total CHOL/HDL Ratio 3    NonHDL 112.83   Comprehensive metabolic panel   Collection Time: 03/24/23 12:41 PM  Result Value Ref Range   Sodium 139 135 - 145 mEq/L   Potassium 4.0 3.5 - 5.1 mEq/L   Chloride 99 96 - 112 mEq/L   CO2 31 19 - 32 mEq/L   Glucose, Bld 99 70 - 99 mg/dL   BUN 13 6 - 23 mg/dL   Creatinine, Ser 4.09 0.40 - 1.20 mg/dL   Total Bilirubin 0.5 0.2 - 1.2 mg/dL   Alkaline Phosphatase 90 39 - 117 U/L   AST 20 0 - 37 U/L   ALT 19 0 - 35 U/L   Total Protein 7.2 6.0 - 8.3 g/dL   Albumin 4.3 3.5 - 5.2 g/dL   GFR 81.19 >14.78 mL/min   Calcium 9.5 8.4 - 10.5 mg/dL    Assessment and Plan  Acute cough Assessment & Plan: Acute, most likely viral upper respiratory tract infection plus or minus bacterial infection that is now resolved.  Viral infection possibly secondary to RSV given significant amount of bronchospasm and wheeze associated. She has had significant improvement with initiation of prednisone and albuterol.  Using cough suppressant at night to rest.  Encouraged continued p.o. intake, rest and will continue prednisone taper for another 5 days. Can use albuterol as needed.  Start Mucinex DM during the day and continue Promethazine DM cough syrup at night.  Return and ER precautions provided   Other orders -     predniSONE; 2 tabs by mouth daily x 3 days, then 1 tabs by mouth daily x 2 days  Dispense: 8 tablet; Refill: 0    No follow-ups on file.   Kerby Nora, MD

## 2023-04-02 ENCOUNTER — Encounter: Payer: Self-pay | Admitting: Family Medicine

## 2023-04-02 ENCOUNTER — Ambulatory Visit (INDEPENDENT_AMBULATORY_CARE_PROVIDER_SITE_OTHER): Payer: Medicare HMO | Admitting: Family Medicine

## 2023-04-02 VITALS — BP 120/74 | HR 94 | Temp 98.4°F | Ht 62.25 in | Wt 185.2 lb

## 2023-04-02 DIAGNOSIS — J4521 Mild intermittent asthma with (acute) exacerbation: Secondary | ICD-10-CM | POA: Diagnosis not present

## 2023-04-02 DIAGNOSIS — J45909 Unspecified asthma, uncomplicated: Secondary | ICD-10-CM | POA: Insufficient documentation

## 2023-04-02 DIAGNOSIS — E7800 Pure hypercholesterolemia, unspecified: Secondary | ICD-10-CM | POA: Diagnosis not present

## 2023-04-02 DIAGNOSIS — Z0000 Encounter for general adult medical examination without abnormal findings: Secondary | ICD-10-CM

## 2023-04-02 DIAGNOSIS — I10 Essential (primary) hypertension: Secondary | ICD-10-CM | POA: Diagnosis not present

## 2023-04-02 MED ORDER — IPRATROPIUM-ALBUTEROL 0.5-2.5 (3) MG/3ML IN SOLN
3.0000 mL | Freq: Once | RESPIRATORY_TRACT | Status: AC
  Administered 2023-04-02: 3 mL via RESPIRATORY_TRACT

## 2023-04-02 MED ORDER — FLUTICASONE-SALMETEROL 100-50 MCG/ACT IN AEPB: 1.0000 | INHALATION_SPRAY | Freq: Two times a day (BID) | RESPIRATORY_TRACT | 0 refills | Status: DC

## 2023-04-02 NOTE — Assessment & Plan Note (Signed)
Acute, significant bronchospasm and some in response to recent viral infection possible unproven RSV.  Some improved with prednisone but only approximately 20%.  Treated in office today with DuoNeb which improved her movement of air.  Continue albuterol 2 puffs every 4-6 hours as needed Will start her on generic Advair 1 puff twice daily with plan to increase if not improving over the next 3 to 5 days. She will complete prednisone taper. Chest x-ray was unremarkable at last office visit.  Given at baseline she normally does not have wheezing or reactive airway symptoms I do not foresee that she will need a long-term inhaler.

## 2023-04-02 NOTE — Assessment & Plan Note (Signed)
Stable, chronic.  Continue current medication.   Losartan 50 mg p.o. daily HCTZ 12.5 mg p.o. daily

## 2023-04-02 NOTE — Progress Notes (Signed)
Patient ID: Beverly Quinn, female    DOB: Feb 16, 1956, 67 y.o.   MRN: [id number]  This visit was conducted in person.  BP 120/74 (BP Location: Left Arm, Patient Position: Sitting, Cuff Size: Large)   Pulse 94   Temp 98.4 F (36.9 C) (Temporal)   Ht 5' 2.25" (1.581 m)   Wt 185 lb 4 oz (84 kg)   LMP 04/13/2004   SpO2 94%   BMI 33.61 kg/m    CC:  Chief Complaint  Patient presents with   Medicare Wellness    Subjective:   HPI: Beverly Quinn is a 67 y.o. female presenting on 04/02/2023 for Medicare Wellness  The patient presents for annual medicare wellness, complete physical and review of chronic health problems. He/She also has the following acute concerns today:   Cough and wheeze.. possible RSV.Marland Kitchen improved with steroid but  still using albuterol every 4 hours.  I have personally reviewed the Medicare Annual Wellness questionnaire and have noted 1. The patient's medical and social history 2. Their use of alcohol, tobacco or illicit drugs 3. Their current medications and supplements 4. The patient's functional ability including ADL's, fall risks, home safety risks and hearing or visual             impairment. 5. Diet and physical activities 6. Evidence for depression or mood disorders 7.         Updated provider list Cognitive evaluation was performed and recorded on pt medicare questionnaire form. The patients weight, height, BMI and visual acuity have been recorded in the chart   I have made referrals, counseling and provided education to the patient based review of the above and I have provided the pt with a written personalized care plan for preventive services.   Documentation of this information was scanned into the electronic record under the media tab.   Advance directives and end of life planning reviewed in detail with patient and documented in EMR. Patient given handout on advance care directives if needed. HCPOA and living will updated if needed.  Hearing  Screening  Method: Audiometry   500Hz  1000Hz  2000Hz  4000Hz   Right ear 20 20 20 20   Left ear 20 20 20 20   Vision Screening - Comments:: Wears Glasses-Eye Exam with Dr. Cathey Endow 04/2022  Flowsheet Row Office Visit from 03/24/2023 in [facility] [place]  PHQ-2 Total Score 0         04/02/2023    2:17 PM 03/24/2023   11:52 AM 01/23/2022    2:32 PM 01/07/2021    9:44 AM 12/26/2019    8:18 AM  Fall Risk   Falls in the past year? 0 0 0 0 0  Number falls in past yr: 0 0 0  0  Injury with Fall? 0 0 0  0  Risk for fall due to : No Fall Risks No Fall Risks No Fall Risks  No Fall Risks  Follow up Falls evaluation completed Falls evaluation completed Falls prevention discussed  Falls evaluation completed;Falls prevention discussed   Hypertension:    Good control. BP Readings from Last 3 Encounters:  04/02/23 120/74  03/30/23 120/64  03/28/23 (!) 141/84  Using medication without problems or lightheadedness: none Chest pain with exertion: none She has had severeal episodes of sharp right sided chest wall pain at edge of sternum since increasing weight at gym. Edema: none Short of breath: none Average home BPs:  not checking recently. Other issues:  Elevated Cholesterol: On  atorvastatin 10 mg daily Lab Results  Component Value Date   CHOL 181 03/24/2023   HDL 68.50 03/24/2023   LDLCALC 94 03/24/2023   TRIG 95.0 03/24/2023   CHOLHDL 3 03/24/2023  Using medications without problems: using coQ10 for SE. Muscle aches:  Diet compliance: healthy Exercise: gym 4 times a week, walking. Other complaints: The 10-year ASCVD risk score (Arnett DK, et al., 2019) is: 9.2%   Values used to calculate the score:     Age: 6 years     Sex: Female     Is Non-Hispanic African American: No     Diabetic: No     Tobacco smoker: No     Systolic Blood Pressure: 120 mmHg     Is BP treated: Yes     HDL Cholesterol: 68.5 mg/dL     Total Cholesterol: 181 mg/dL  GAD resolved on  no med.      Relevant past medical, surgical, family and social history reviewed and updated as indicated. Interim medical history since our last visit reviewed. Allergies and medications reviewed and updated. Outpatient Medications Prior to Visit  Medication Sig Dispense Refill   atorvastatin (LIPITOR) 10 MG tablet Take 1 tablet (10 mg total) by mouth daily. 100 tablet 1   benzonatate (TESSALON) 200 MG capsule Take 1 capsule (200 mg total) by mouth 3 (three) times daily as needed for cough. 20 capsule 0   Coenzyme Q10 (CO Q 10 PO) Take 1 tablet by mouth. 4 times a week     Cyanocobalamin (VITAMIN B-12 PO) Take 1 tablet by mouth daily.     fluticasone (FLONASE) 50 MCG/ACT nasal spray Place 2 sprays into both nostrils daily as needed.      hydrochlorothiazide (MICROZIDE) 12.5 MG capsule TAKE 1 CAPSULE BY MOUTH EVERY DAY 90 capsule 3   Ibuprofen (ADVIL PO) Take by mouth as needed.     losartan (COZAAR) 50 MG tablet TAKE 1 TABLET BY MOUTH EVERY DAY 90 tablet 0   montelukast (SINGULAIR) 10 MG tablet TAKE 1 TABLET BY MOUTH EVERY DAY AS NEEDED 90 tablet 3   Multiple Vitamins-Minerals (MULTIVITAMIN PO) Take by mouth daily.     predniSONE (DELTASONE) 20 MG tablet 2 tabs by mouth daily x 3 days, then 1 tabs by mouth daily x 2 days 8 tablet 0   valACYclovir (VALTREX) 1000 MG tablet Take one for 3 days at onset of outbreak 90 tablet 1   VITAMIN D, ERGOCALCIFEROL, PO Take 1,000 Int'l Units by mouth daily.      No facility-administered medications prior to visit.     Per HPI unless specifically indicated in ROS section below Review of Systems  Constitutional:  Negative for fatigue and fever.  HENT:  Negative for congestion and ear pain.   Eyes:  Negative for pain.  Respiratory:  Positive for cough and shortness of breath. Negative for chest tightness.   Cardiovascular:  Negative for chest pain, palpitations and leg swelling.  Gastrointestinal:  Negative for abdominal pain.  Genitourinary:   Negative for dysuria and vaginal bleeding.  Musculoskeletal:  Negative for back pain.  Neurological:  Negative for syncope, light-headedness and headaches.  Psychiatric/Behavioral:  Negative for dysphoric mood.    Objective:  BP 120/74 (BP Location: Left Arm, Patient Position: Sitting, Cuff Size: Large)   Pulse 94   Temp 98.4 F (36.9 C) (Temporal)   Ht 5' 2.25" (1.581 m)   Wt 185 lb 4 oz (84 kg)   LMP 04/13/2004   SpO2 94%  BMI 33.61 kg/m   Wt Readings from Last 3 Encounters:  04/02/23 185 lb 4 oz (84 kg)  03/30/23 186 lb 4 oz (84.5 kg)  03/28/23 185 lb (83.9 kg)      Physical Exam Constitutional:      General: She is not in acute distress.    Appearance: Normal appearance. She is well-developed. She is not ill-appearing or toxic-appearing.  HENT:     Head: Normocephalic.     Right Ear: Hearing, tympanic membrane, ear canal and external ear normal. Tympanic membrane is not erythematous, retracted or bulging.     Left Ear: Hearing, tympanic membrane, ear canal and external ear normal. Tympanic membrane is not erythematous, retracted or bulging.     Nose: No mucosal edema or rhinorrhea.     Right Sinus: No maxillary sinus tenderness or frontal sinus tenderness.     Left Sinus: No maxillary sinus tenderness or frontal sinus tenderness.     Mouth/Throat:     Pharynx: Uvula midline.  Eyes:     General: Lids are normal. Lids are everted, no foreign bodies appreciated.     Conjunctiva/sclera: Conjunctivae normal.     Pupils: Pupils are equal, round, and reactive to light.  Neck:     Thyroid: No thyroid mass or thyromegaly.     Vascular: No carotid bruit.     Trachea: Trachea normal.  Cardiovascular:     Rate and Rhythm: Normal rate and regular rhythm.     Pulses: Normal pulses.     Heart sounds: Normal heart sounds, S1 normal and S2 normal. No murmur heard.    No friction rub. No gallop.  Pulmonary:     Effort: Pulmonary effort is normal. No tachypnea or respiratory  distress.     Breath sounds: Decreased breath sounds and wheezing present. No rhonchi or rales.     Comments: Wheeze and air movement improved status post DuoNeb. Abdominal:     General: Bowel sounds are normal.     Palpations: Abdomen is soft.     Tenderness: There is no abdominal tenderness.  Musculoskeletal:     Cervical back: Normal range of motion and neck supple.  Skin:    General: Skin is warm and dry.     Findings: No rash.  Neurological:     Mental Status: She is alert.  Psychiatric:        Mood and Affect: Mood is not anxious or depressed.        Speech: Speech normal.        Behavior: Behavior normal. Behavior is cooperative.        Thought Content: Thought content normal.        Judgment: Judgment normal.       Results for orders placed or performed in visit on 03/24/23  Lipid panel   Collection Time: 03/24/23 12:41 PM  Result Value Ref Range   Cholesterol 181 0 - 200 mg/dL   Triglycerides 40.9 0.0 - 149.0 mg/dL   HDL 81.19 >14.78 mg/dL   VLDL 29.5 0.0 - 62.1 mg/dL   LDL Cholesterol 94 0 - 99 mg/dL   Total CHOL/HDL Ratio 3    NonHDL 112.83   Comprehensive metabolic panel   Collection Time: 03/24/23 12:41 PM  Result Value Ref Range   Sodium 139 135 - 145 mEq/L   Potassium 4.0 3.5 - 5.1 mEq/L   Chloride 99 96 - 112 mEq/L   CO2 31 19 - 32 mEq/L   Glucose, Bld 99 70 -  99 mg/dL   BUN 13 6 - 23 mg/dL   Creatinine, Ser 3.24 0.40 - 1.20 mg/dL   Total Bilirubin 0.5 0.2 - 1.2 mg/dL   Alkaline Phosphatase 90 39 - 117 U/L   AST 20 0 - 37 U/L   ALT 19 0 - 35 U/L   Total Protein 7.2 6.0 - 8.3 g/dL   Albumin 4.3 3.5 - 5.2 g/dL   GFR 40.10 >27.25 mL/min   Calcium 9.5 8.4 - 10.5 mg/dL    This visit occurred during the SARS-CoV-2 public health emergency.  Safety protocols were in place, including screening questions prior to the visit, additional usage of staff PPE, and extensive cleaning of exam room while observing appropriate contact time as indicated for  disinfecting solutions.   COVID 19 screen:  No recent travel or known exposure to COVID19 The patient denies respiratory symptoms of COVID 19 at this time. The importance of social distancing was discussed today.   Assessment and Plan The patient's preventative maintenance and recommended screening tests for an annual wellness exam were reviewed in full today. Brought up to date unless services declined.  Counselled on the importance of diet, exercise, and its role in overall health and mortality. The patient's FH and SH was reviewed, including their home life, tobacco status, and drug and alcohol status.   Vaccines: Uptodate tdap, had shingles vaccine in 2015, uptodate PNA13/PNA23, COVID x 3 and high dose flu  done, plan RSV.  Pap/DVE:  Per GYN, 09/28/2018, no further indicated. Mammo:  11/2022 Bone Density: 2016, 2023 normal.repeat in 5 years...  Colon:  Mother colon cancer repeat q 5 years , Buccini.. 01/07/2018 DUE. Scheduled 2025 Smoking Status:none ETOH/ drug use:  couple times a month/ none  Hep C: neg  HIV screen: neg    Problem List Items Addressed This Visit     Hyperlipidemia   Stable, chronic.  Continue current medication.   Atorvastatin 10 mg p.o. daily      Hypertension   Stable, chronic.  Continue current medication.   Losartan 50 mg p.o. daily HCTZ 12.5 mg p.o. daily      Reactive airway disease with wheezing   Acute, significant bronchospasm and some in response to recent viral infection possible unproven RSV.  Some improved with prednisone but only approximately 20%.  Treated in office today with DuoNeb which improved her movement of air.  Continue albuterol 2 puffs every 4-6 hours as needed Will start her on generic Advair 1 puff twice daily with plan to increase if not improving over the next 3 to 5 days. She will complete prednisone taper. Chest x-ray was unremarkable at last office visit.  Given at baseline she normally does not have wheezing or  reactive airway symptoms I do not foresee that she will need a long-term inhaler.      Other Visit Diagnoses       Medicare annual wellness visit, subsequent    -  Primary         Beverly Nora, MD

## 2023-04-02 NOTE — Assessment & Plan Note (Signed)
Stable, chronic.  Continue current medication.  Atorvastatin 10 mg p.o. daily

## 2023-04-06 ENCOUNTER — Encounter: Payer: Self-pay | Admitting: Family Medicine

## 2023-04-09 ENCOUNTER — Other Ambulatory Visit: Payer: Self-pay | Admitting: Family Medicine

## 2023-04-29 ENCOUNTER — Other Ambulatory Visit: Payer: Self-pay | Admitting: Family Medicine

## 2023-04-29 ENCOUNTER — Encounter: Payer: Self-pay | Admitting: *Deleted

## 2023-04-29 NOTE — Telephone Encounter (Signed)
Spoke with Ms. Millican.  She states she does not need a refills.  This was a one time medication.  Refill denied.

## 2023-05-06 DIAGNOSIS — Z860101 Personal history of adenomatous and serrated colon polyps: Secondary | ICD-10-CM | POA: Diagnosis not present

## 2023-05-06 DIAGNOSIS — K5730 Diverticulosis of large intestine without perforation or abscess without bleeding: Secondary | ICD-10-CM | POA: Diagnosis not present

## 2023-05-06 DIAGNOSIS — Z09 Encounter for follow-up examination after completed treatment for conditions other than malignant neoplasm: Secondary | ICD-10-CM | POA: Diagnosis not present

## 2023-05-06 DIAGNOSIS — D123 Benign neoplasm of transverse colon: Secondary | ICD-10-CM | POA: Diagnosis not present

## 2023-05-06 LAB — HM COLONOSCOPY

## 2023-05-21 DIAGNOSIS — H524 Presbyopia: Secondary | ICD-10-CM | POA: Diagnosis not present

## 2023-05-21 DIAGNOSIS — H2513 Age-related nuclear cataract, bilateral: Secondary | ICD-10-CM | POA: Diagnosis not present

## 2023-06-08 DIAGNOSIS — M25561 Pain in right knee: Secondary | ICD-10-CM | POA: Diagnosis not present

## 2023-06-09 DIAGNOSIS — M25561 Pain in right knee: Secondary | ICD-10-CM | POA: Diagnosis not present

## 2023-06-25 DIAGNOSIS — M25561 Pain in right knee: Secondary | ICD-10-CM | POA: Diagnosis not present

## 2023-07-04 ENCOUNTER — Other Ambulatory Visit: Payer: Self-pay | Admitting: Family Medicine

## 2023-07-06 DIAGNOSIS — D2261 Melanocytic nevi of right upper limb, including shoulder: Secondary | ICD-10-CM | POA: Diagnosis not present

## 2023-07-06 DIAGNOSIS — D2262 Melanocytic nevi of left upper limb, including shoulder: Secondary | ICD-10-CM | POA: Diagnosis not present

## 2023-07-06 DIAGNOSIS — Z85820 Personal history of malignant melanoma of skin: Secondary | ICD-10-CM | POA: Diagnosis not present

## 2023-07-06 DIAGNOSIS — Z08 Encounter for follow-up examination after completed treatment for malignant neoplasm: Secondary | ICD-10-CM | POA: Diagnosis not present

## 2023-07-06 DIAGNOSIS — D225 Melanocytic nevi of trunk: Secondary | ICD-10-CM | POA: Diagnosis not present

## 2023-07-06 DIAGNOSIS — D2271 Melanocytic nevi of right lower limb, including hip: Secondary | ICD-10-CM | POA: Diagnosis not present

## 2023-07-06 DIAGNOSIS — L821 Other seborrheic keratosis: Secondary | ICD-10-CM | POA: Diagnosis not present

## 2023-07-06 DIAGNOSIS — Z86006 Personal history of melanoma in-situ: Secondary | ICD-10-CM | POA: Diagnosis not present

## 2023-07-06 DIAGNOSIS — D2272 Melanocytic nevi of left lower limb, including hip: Secondary | ICD-10-CM | POA: Diagnosis not present

## 2023-09-27 ENCOUNTER — Encounter: Payer: Self-pay | Admitting: Family Medicine

## 2023-09-27 ENCOUNTER — Ambulatory Visit: Admitting: Family Medicine

## 2023-09-27 VITALS — BP 145/77 | HR 74 | Wt 192.0 lb

## 2023-09-27 DIAGNOSIS — B372 Candidiasis of skin and nail: Secondary | ICD-10-CM

## 2023-09-27 MED ORDER — NYSTATIN 100000 UNIT/GM EX CREA: 1.0000 | TOPICAL_CREAM | Freq: Two times a day (BID) | CUTANEOUS | 1 refills | Status: DC

## 2023-09-27 NOTE — Progress Notes (Signed)
 Dryness on her external labia x few months  Has been using coconut oil, which has been helping some

## 2023-09-27 NOTE — Progress Notes (Signed)
   GYNECOLOGY PROBLEM  VISIT ENCOUNTER NOTE  Subjective:   Beverly Quinn is a 68 y.o. G57P0010 female here for a problem GYN visit.  Current complaints: vaginal itching, mostly up at the top of vagina near clitoris. Feels irritated.   Going to [place]  for the summer. Denies abnormal vaginal bleeding, discharge, pelvic pain, problems with intercourse or other gynecologic concerns.    Gynecologic History Patient's last menstrual period was 04/13/2004.  Contraception: post menopausal status  Health Maintenance Due  Topic Date Due   COVID-19 Vaccine (7 - 2024-25 season) 06/24/2023    The following portions of the patient's history were reviewed and updated as appropriate: allergies, current medications, past family history, past medical history, past social history, past surgical history and problem list.  Review of Systems Pertinent items are noted in HPI.   Objective:  BP (!) 145/77   Pulse 74   Wt 192 lb (87.1 kg)   LMP 04/13/2004   BMI 34.84 kg/m   Gen: well appearing, NAD HEENT: no scleral icterus CV: RR Lung: Normal WOB Ext: warm well perfused  PELVIC: Normal appearing external genitalia except for white plaques in the creases of clitoral hood; normal appearing vaginal mucosa and cervix.  No abnormal discharge noted.     Assessment and Plan:   1. Skin yeast infection (Primary) - nystatin cream (MYCOSTATIN); Apply 1 Application topically 2 (two) times daily. Apply to affected area of the clitoral hood. Typically needed for 1-2 weeks.  Dispense: 30 g; Refill: 1  Please refer to After Visit Summary for other counseling recommendations.   No follow-ups on file.  Suzen Maryan Masters, MD, MPH, ABFM Attending Physician Faculty Practice- [facility]

## 2023-12-16 ENCOUNTER — Other Ambulatory Visit: Payer: Self-pay | Admitting: Family Medicine

## 2023-12-16 DIAGNOSIS — Z1231 Encounter for screening mammogram for malignant neoplasm of breast: Secondary | ICD-10-CM

## 2023-12-22 ENCOUNTER — Telehealth: Payer: Self-pay | Admitting: Family Medicine

## 2023-12-22 NOTE — Telephone Encounter (Signed)
 Medication not on list; attempted to call patient x 1, no answer.

## 2023-12-22 NOTE — Telephone Encounter (Unsigned)
 Copied from CRM [id number]. Topic: Clinical - Medication Refill >> Dec 22, 2023  3:21 PM Robinson H wrote: Medication: triamcinolone  cream (KENALOG ) 0.5 %  Has the patient contacted their pharmacy? Yes, pharmacy needs new prescription (Agent: If no, request that the patient contact the pharmacy for the refill. If patient does not wish to contact the pharmacy document the reason why and proceed with request.) (Agent: If yes, when and what did the pharmacy advise?)  This is the patient's preferred pharmacy:  [facility] [phone] [facility] [place] - [address] [place] [address] Phone: [phone] Fax: [phone]    Is this the correct pharmacy for this prescription? Yes If no, delete pharmacy and type the correct one.   Has the prescription been filled recently? No  Is the patient out of the medication? Yes  Has the patient been seen for an appointment in the last year OR does the patient have an upcoming appointment? Yes  Can we respond through MyChart? Yes  Agent: Please be advised that Rx refills may take up to 3 business days. We ask that you follow-up with your pharmacy.

## 2023-12-23 NOTE — Telephone Encounter (Signed)
 Noted.  Please attempt to contact patient to determine what she is using this for.  If it is a recurrent issue I will likely refill.  If it is a new issue she may need to be seen.

## 2023-12-24 MED ORDER — TRIAMCINOLONE ACETONIDE 0.5 % EX CREA: 1.0000 | TOPICAL_CREAM | Freq: Two times a day (BID) | CUTANEOUS | 0 refills | Status: DC

## 2023-12-24 NOTE — Addendum Note (Signed)
 Addended by: AVELINA NO E on: 12/24/2023 04:59 PM   Modules accepted: Orders

## 2023-12-24 NOTE — Telephone Encounter (Signed)
 Continued issues just has not used long at all. But symptoms are the same

## 2023-12-28 ENCOUNTER — Ambulatory Visit
Admission: RE | Admit: 2023-12-28 | Discharge: 2023-12-28 | Disposition: A | Discharge status: Home / Self Care | Source: Ambulatory Visit | Attending: Family Medicine | Admitting: Family Medicine

## 2023-12-28 DIAGNOSIS — Z1231 Encounter for screening mammogram for malignant neoplasm of breast: Secondary | ICD-10-CM | POA: Diagnosis not present

## 2023-12-29 ENCOUNTER — Ambulatory Visit: Payer: Self-pay | Admitting: Family Medicine

## 2024-02-22 ENCOUNTER — Ambulatory Visit: Payer: Self-pay

## 2024-02-22 NOTE — Telephone Encounter (Signed)
 Copied from CRM [id number]. Topic: Clinical - Red Word Triage >> Feb 22, 2024 10:50 AM Mesmerise C wrote: [facility] that prompted transfer to Nurse Triage: Patient believes she may have poison ivy since Friday states it's pink with blisters, tried otc remedies but no improvement believes it may be worse >> Feb 22, 2024  3:06 PM Pinkey ORN wrote: Patient is wanting to cancel the appointment made by NT

## 2024-02-22 NOTE — Telephone Encounter (Signed)
 FYI Only or Action Required?: FYI only for provider: appointment scheduled on 02/24/24.  Patient was last seen in primary care on 04/02/2023 by Avelina Greig BRAVO, MD.  [facility] Nurse Triage reporting [facility]  Symptoms began several days ago.  Interventions attempted: OTC medications: topical Benadryl and Other: soap, tea tree oil, lavender oil.  Symptoms are: gradually worsening.  Triage Disposition: See Within 3 Days in Office (overriding See Physician Within 24 Hours)  Patient/caregiver understands and will follow disposition?: Yes                            Copied from CRM [id number]. Topic: Clinical - Red Word Triage >> Feb 22, 2024 10:50 AM Mesmerise C wrote: [facility] that prompted transfer to Nurse Triage: Patient believes she may have poison ivy since Friday states it's pink with blisters, tried otc remedies but no improvement believes it may be worse  Reason for Disposition  Severe poison ivy, oak, or sumac reaction in the past  Answer Assessment - Initial Assessment Questions 1. APPEARANCE of RASH: What does the rash look like?      Bubbles, pink and itchiness 2. LOCATION: Where is the rash located?  (e.g., face, genitals, hands, legs)     Right arm in between elbow and rash 3. SIZE: How large is the rash?      Estimates about 6 spots that range in size 4. ONSET: When did the rash begin?      Friday 5. ITCHING: Does the rash itch? If Yes, ask: How bad is it?     Itches upon contact 6. EXPOSURE:  How were you exposed to the plant (poison ivy, poison oak, sumac)  When were you exposed?  Note: Sometimes a poison ivy/oak/sumac rash does not appear for 2 to 3 weeks after exposure.      Working in yard about a week ago  7. PAST HISTORY: Have you had a poison ivy rash before? If Yes, ask: How bad was it?     Yes, but states it has been a long time, states rash was on face and chest and it was a bad reaction 8. OTHER SYMPTOMS: Do  you have any other symptoms? (e.g., fever)      Denies fever 9. PREGNANCY: Is there any chance you are pregnant? When was your last menstrual period?     N/A  Protocols used: Poison Ivy - Oak - Sumac-A-AH

## 2024-02-22 NOTE — Telephone Encounter (Signed)
 Pt called in today stating poison ivy is feeling better and would like to cancel appt.  Nurse informed pt is s/s begin to increase / change to call us  back to schedule new appt: pt verbalized understanding.

## 2024-02-22 NOTE — Telephone Encounter (Signed)
 Noted. Thanks.

## 2024-02-23 ENCOUNTER — Ambulatory Visit: Payer: Self-pay

## 2024-02-23 NOTE — Telephone Encounter (Signed)
 FYI Only or Action Required?: FYI only for provider: appointment scheduled on 02/24/24.  Patient was last seen in primary care on 04/02/2023 by Avelina Greig BRAVO, MD.  Called Nurse Triage reporting Rash.  Symptoms began several days ago.  Interventions attempted: OTC medications: Benadryl cream.  Symptoms are: unchanged.  Triage Disposition: Information or Advice Only Call  Patient/caregiver understands and will follow disposition?: Yes Reason for Disposition  Health information question, no triage required and triager able to answer question  Answer Assessment - Initial Assessment Questions Rash noticed Friday, patient has been using Benadryl cream, dawn dish soap, Tea tree oil, lavender oil.  1. REASON FOR CALL: What is the main reason for your call? or How can I best help you?     Calling for appointment to get rash looked at, she canceled previous appointment due to improvement but woke up with 2 new spots  2. SYMPTOMS : Do you have any symptoms?      Original spots are on inside of right forearm, 2 new spots on the backside  Protocols used: Information Only Call - No Triage-A-AH  Copied from CRM [id number]. Topic: Clinical - Red Word Triage >> Feb 23, 2024 12:57 PM Carlyon D wrote: Red Word that prompted transfer to Nurse Triage: rash like blisters thinks its poison ivy has two new spots on the outside of the forearm very itchy.

## 2024-02-23 NOTE — Telephone Encounter (Signed)
 Appointment with Adina Crandall 02/25/24.

## 2024-02-23 NOTE — Telephone Encounter (Signed)
 Noted. Will evaluate in office

## 2024-02-24 ENCOUNTER — Ambulatory Visit: Admitting: Family Medicine

## 2024-02-24 ENCOUNTER — Ambulatory Visit (INDEPENDENT_AMBULATORY_CARE_PROVIDER_SITE_OTHER): Admitting: Family Medicine

## 2024-02-24 VITALS — BP 136/88 | HR 63 | Temp 98.6°F | Ht 62.25 in | Wt 176.0 lb

## 2024-02-24 DIAGNOSIS — L237 Allergic contact dermatitis due to plants, except food: Secondary | ICD-10-CM | POA: Insufficient documentation

## 2024-02-24 MED ORDER — BETAMETHASONE DIPROPIONATE 0.05 % EX CREA: TOPICAL_CREAM | Freq: Two times a day (BID) | CUTANEOUS | 0 refills | Status: AC

## 2024-02-24 NOTE — Assessment & Plan Note (Signed)
 Acute, consistent with poison ivy dermatitis. Will treat with Depo-Medrol 40 mg IM x 1 Will increase topical steroid to betamethasone twice daily. She will call if symptoms are not improving as expected for possible addition of prednisone  taper.  Reviewed signs and symptoms of infection and anaphylaxis for her to be on the look out for. Return and ER precautions provided.

## 2024-02-24 NOTE — Telephone Encounter (Signed)
 Called both patient and have agreed to change. Schedule will be updated by reception

## 2024-02-24 NOTE — Progress Notes (Signed)
 Patient ID: Beverly Quinn, female    DOB: [birth year], 68 y.o.   MRN: [id number]  This visit was conducted in person.  BP 136/88   Pulse 63   Temp 98.6 F (37 C) (Oral)   Ht 5' 2.25 (1.581 m)   Wt 176 lb (79.8 kg)   LMP 04/13/2004   SpO2 98%   BMI 31.93 kg/m    CC:  Chief Complaint  Patient presents with   Poison Porter    Was working in a year a week ago and noticed a few bumps last Friday. Saturday bubbles started forming. Right forearm.     Subjective:   HPI: Beverly Quinn is a 68 y.o. female presenting on 02/24/2024 for Poison Porter (Was working in a year a week ago and noticed a few bumps last Friday. Saturday bubbles started forming. Right forearm. )  New onset rash rash on right  forearm, blisters.. now spreading up arm.  1 week prior doing outdoor clean up.. possible plant exposure.   No oral swelling, no respiratory issues.  No SOB.    Has tried OTC meds.. benadryl cream etc.       Relevant past medical, surgical, family and social history reviewed and updated as indicated. Interim medical history since our last visit reviewed. Allergies and medications reviewed and updated. Outpatient Medications Prior to Visit  Medication Sig Dispense Refill   atorvastatin  (LIPITOR) 10 MG tablet TAKE 1 TABLET BY MOUTH EVERY DAY 100 tablet 2   Coenzyme Q10 (CO Q 10 PO) Take 1 tablet by mouth. 4 times a week     Cyanocobalamin (VITAMIN B-12 PO) Take 1 tablet by mouth daily.     fluticasone  (FLONASE) 50 MCG/ACT nasal spray Place 2 sprays into both nostrils daily as needed.      hydrochlorothiazide  (MICROZIDE ) 12.5 MG capsule TAKE 1 CAPSULE BY MOUTH EVERY DAY 90 capsule 3   Ibuprofen (ADVIL PO) Take by mouth as needed.     losartan  (COZAAR ) 50 MG tablet TAKE 1 TABLET BY MOUTH EVERY DAY 100 tablet 2   montelukast  (SINGULAIR ) 10 MG tablet TAKE 1 TABLET BY MOUTH EVERY DAY AS NEEDED 90 tablet 3   Multiple Vitamins-Minerals (MULTIVITAMIN PO) Take by mouth daily.     nystatin  cream  (MYCOSTATIN ) Apply 1 Application topically 2 (two) times daily. Apply to affected area of the clitoral hood. Typically needed for 1-2 weeks. 30 g 1   valACYclovir  (VALTREX ) 1000 MG tablet Take one for 3 days at onset of outbreak 90 tablet 1   VITAMIN D , ERGOCALCIFEROL , PO Take 1,000 Int'l Units by mouth daily.      triamcinolone  cream (KENALOG ) 0.5 % Apply 1 Application topically 2 (two) times daily. 30 g 0   benzonatate  (TESSALON ) 200 MG capsule Take 1 capsule (200 mg total) by mouth 3 (three) times daily as needed for cough. 20 capsule 0   fluticasone -salmeterol (WIXELA INHUB) 100-50 MCG/ACT AEPB INHALE 1 PUFF INTO THE LUNGS TWICE A DAY 60 each 2   predniSONE  (DELTASONE ) 20 MG tablet 2 tabs by mouth daily x 3 days, then 1 tabs by mouth daily x 2 days 8 tablet 0   No facility-administered medications prior to visit.     Per HPI unless specifically indicated in ROS section below Review of Systems  Constitutional:  Negative for fatigue and fever.  HENT:  Negative for congestion.   Eyes:  Negative for pain.  Respiratory:  Negative for cough and shortness of breath.  Cardiovascular:  Negative for chest pain, palpitations and leg swelling.  Gastrointestinal:  Negative for abdominal pain.  Genitourinary:  Negative for dysuria and vaginal bleeding.  Musculoskeletal:  Negative for back pain.  Neurological:  Negative for syncope, light-headedness and headaches.  Psychiatric/Behavioral:  Negative for dysphoric mood.    Objective:  BP 136/88   Pulse 63   Temp 98.6 F (37 C) (Oral)   Ht 5' 2.25 (1.581 m)   Wt 176 lb (79.8 kg)   LMP 04/13/2004   SpO2 98%   BMI 31.93 kg/m   Wt Readings from Last 3 Encounters:  02/24/24 176 lb (79.8 kg)  09/27/23 192 lb (87.1 kg)  04/02/23 185 lb 4 oz (84 kg)      Physical Exam Constitutional:      General: She is not in acute distress.    Appearance: Normal appearance. She is well-developed. She is not ill-appearing or toxic-appearing.  HENT:      Head: Normocephalic.     Right Ear: Hearing, tympanic membrane, ear canal and external ear normal. Tympanic membrane is not erythematous, retracted or bulging.     Left Ear: Hearing, tympanic membrane, ear canal and external ear normal. Tympanic membrane is not erythematous, retracted or bulging.     Nose: No mucosal edema or rhinorrhea.     Right Sinus: No maxillary sinus tenderness or frontal sinus tenderness.     Left Sinus: No maxillary sinus tenderness or frontal sinus tenderness.     Mouth/Throat:     Pharynx: Uvula midline.  Eyes:     General: Lids are normal. Lids are everted, no foreign bodies appreciated.     Conjunctiva/sclera: Conjunctivae normal.     Pupils: Pupils are equal, round, and reactive to light.  Neck:     Thyroid : No thyroid  mass or thyromegaly.     Vascular: No carotid bruit.     Trachea: Trachea normal.  Cardiovascular:     Rate and Rhythm: Normal rate and regular rhythm.     Pulses: Normal pulses.     Heart sounds: Normal heart sounds, S1 normal and S2 normal. No murmur heard.    No friction rub. No gallop.  Pulmonary:     Effort: Pulmonary effort is normal. No tachypnea or respiratory distress.     Breath sounds: Normal breath sounds. No decreased breath sounds, wheezing, rhonchi or rales.  Abdominal:     General: Bowel sounds are normal.     Palpations: Abdomen is soft.     Tenderness: There is no abdominal tenderness.  Musculoskeletal:     Cervical back: Normal range of motion and neck supple.  Skin:    General: Skin is warm and dry.     Findings: Rash present.     Comments:  See photo  Neurological:     Mental Status: She is alert.  Psychiatric:        Mood and Affect: Mood is not anxious or depressed.        Speech: Speech normal.        Behavior: Behavior normal. Behavior is cooperative.        Thought Content: Thought content normal.        Judgment: Judgment normal.       Results for orders placed or performed in visit on 05/14/23  HM  COLONOSCOPY   Collection Time: 05/06/23  4:53 PM  Result Value Ref Range   HM Colonoscopy See Report (in chart) See Report (in chart), Patient Reported  Assessment and Plan  Poison ivy dermatitis Assessment & Plan: Acute, consistent with poison ivy dermatitis. Will treat with Depo-Medrol 40 mg IM x 1 Will increase topical steroid to betamethasone twice daily. She will call if symptoms are not improving as expected for possible addition of prednisone  taper.  Reviewed signs and symptoms of infection and anaphylaxis for her to be on the look out for. Return and ER precautions provided.   Other orders -     Betamethasone Dipropionate; Apply topically 2 (two) times daily.  Dispense: 30 g; Refill: 0    No follow-ups on file.   Greig Ring, MD

## 2024-03-02 DIAGNOSIS — Z86006 Personal history of melanoma in-situ: Secondary | ICD-10-CM | POA: Diagnosis not present

## 2024-03-02 DIAGNOSIS — Z85820 Personal history of malignant melanoma of skin: Secondary | ICD-10-CM | POA: Diagnosis not present

## 2024-03-02 DIAGNOSIS — L820 Inflamed seborrheic keratosis: Secondary | ICD-10-CM | POA: Diagnosis not present

## 2024-03-02 DIAGNOSIS — L2989 Other pruritus: Secondary | ICD-10-CM | POA: Diagnosis not present

## 2024-03-02 DIAGNOSIS — Z08 Encounter for follow-up examination after completed treatment for malignant neoplasm: Secondary | ICD-10-CM | POA: Diagnosis not present

## 2024-03-02 DIAGNOSIS — D1801 Hemangioma of skin and subcutaneous tissue: Secondary | ICD-10-CM | POA: Diagnosis not present

## 2024-03-02 DIAGNOSIS — L821 Other seborrheic keratosis: Secondary | ICD-10-CM | POA: Diagnosis not present

## 2024-03-16 ENCOUNTER — Other Ambulatory Visit: Payer: Self-pay | Admitting: Family Medicine

## 2024-03-22 ENCOUNTER — Other Ambulatory Visit: Payer: Self-pay | Admitting: Family Medicine

## 2024-03-29 ENCOUNTER — Telehealth: Payer: Self-pay | Admitting: *Deleted

## 2024-03-29 DIAGNOSIS — E7800 Pure hypercholesterolemia, unspecified: Secondary | ICD-10-CM

## 2024-03-29 NOTE — Telephone Encounter (Signed)
-----   Message from Veva JINNY Ferrari sent at 03/29/2024  4:00 PM EST ----- Regarding: Lab orders for Fri, 1.2.26 Patient is scheduled for CPX labs, please order future labs, Thanks , Veva

## 2024-04-14 ENCOUNTER — Ambulatory Visit: Payer: Self-pay | Admitting: Family Medicine

## 2024-04-14 ENCOUNTER — Other Ambulatory Visit (INDEPENDENT_AMBULATORY_CARE_PROVIDER_SITE_OTHER)

## 2024-04-14 DIAGNOSIS — E7800 Pure hypercholesterolemia, unspecified: Secondary | ICD-10-CM

## 2024-04-14 LAB — COMPREHENSIVE METABOLIC PANEL WITH GFR
ALT: 21 U/L (ref 3–35)
AST: 19 U/L (ref 5–37)
Albumin: 4.2 g/dL (ref 3.5–5.2)
Alkaline Phosphatase: 80 U/L (ref 39–117)
BUN: 16 mg/dL (ref 6–23)
CO2: 31 meq/L (ref 19–32)
Calcium: 9.5 mg/dL (ref 8.4–10.5)
Chloride: 105 meq/L (ref 96–112)
Creatinine, Ser: 0.77 mg/dL (ref 0.40–1.20)
GFR: 77.99 mL/min
Glucose, Bld: 99 mg/dL (ref 70–99)
Potassium: 3.9 meq/L (ref 3.5–5.1)
Sodium: 142 meq/L (ref 135–145)
Total Bilirubin: 0.5 mg/dL (ref 0.2–1.2)
Total Protein: 6.8 g/dL (ref 6.0–8.3)

## 2024-04-14 LAB — LIPID PANEL
Cholesterol: 184 mg/dL (ref 28–200)
HDL: 84 mg/dL
LDL Cholesterol: 86 mg/dL (ref 10–99)
NonHDL: 99.71
Total CHOL/HDL Ratio: 2
Triglycerides: 70 mg/dL (ref 10.0–149.0)
VLDL: 14 mg/dL (ref 0.0–40.0)

## 2024-04-14 NOTE — Progress Notes (Signed)
 No critical labs need to be addressed urgently. We will discuss labs in detail at upcoming office visit.

## 2024-04-21 ENCOUNTER — Ambulatory Visit: Admitting: Family Medicine

## 2024-04-21 ENCOUNTER — Encounter: Payer: Self-pay | Admitting: Family Medicine

## 2024-04-21 VITALS — BP 100/70 | HR 63 | Temp 97.5°F | Ht 62.5 in | Wt 176.0 lb

## 2024-04-21 DIAGNOSIS — R7303 Prediabetes: Secondary | ICD-10-CM

## 2024-04-21 DIAGNOSIS — I10 Essential (primary) hypertension: Secondary | ICD-10-CM | POA: Diagnosis not present

## 2024-04-21 DIAGNOSIS — R7309 Other abnormal glucose: Secondary | ICD-10-CM | POA: Insufficient documentation

## 2024-04-21 DIAGNOSIS — E66811 Obesity, class 1: Secondary | ICD-10-CM | POA: Insufficient documentation

## 2024-04-21 DIAGNOSIS — Z6831 Body mass index (BMI) 31.0-31.9, adult: Secondary | ICD-10-CM

## 2024-04-21 DIAGNOSIS — Z0000 Encounter for general adult medical examination without abnormal findings: Secondary | ICD-10-CM

## 2024-04-21 DIAGNOSIS — E7800 Pure hypercholesterolemia, unspecified: Secondary | ICD-10-CM

## 2024-04-21 DIAGNOSIS — Z85820 Personal history of malignant melanoma of skin: Secondary | ICD-10-CM

## 2024-04-21 DIAGNOSIS — E6609 Other obesity due to excess calories: Secondary | ICD-10-CM | POA: Diagnosis not present

## 2024-04-21 LAB — POCT GLYCOSYLATED HEMOGLOBIN (HGB A1C): Hemoglobin A1C: 5.8 % — AB (ref 4.0–5.6)

## 2024-04-21 NOTE — Assessment & Plan Note (Signed)
 Melanoma lower left leg 2001, left shoulder 2018 Followed by Derm Dr. Cary. No new concerning lesions today.

## 2024-04-21 NOTE — Assessment & Plan Note (Signed)
Stable, chronic.  Continue current medication.  Atorvastatin 10 mg p.o. daily

## 2024-04-21 NOTE — Addendum Note (Signed)
 Addended by: WENDELL ARLAND RAMAN on: 04/21/2024 12:20 PM   Modules accepted: Orders

## 2024-04-21 NOTE — Assessment & Plan Note (Signed)
Stable, chronic.  Continue current medication.   Losartan 50 mg p.o. daily HCTZ 12.5 mg p.o. daily

## 2024-04-21 NOTE — Progress Notes (Addendum)
 "   Patient ID: LANDRY LOOKINGBILL, female    DOB: [birth year], 69 y.o.   MRN: [id number]  This visit was conducted in person.  BP 100/70   Pulse 63   Temp (!) 97.5 F (36.4 C) (Temporal)   Ht 5' 2.5 (1.588 m)   Wt 176 lb (79.8 kg)   LMP 04/13/2004   SpO2 96%   BMI 31.68 kg/m    CC:  Chief Complaint  Patient presents with   Medicare Wellness    Subjective:   HPI: SHANEL PRAZAK is a 69 y.o. female presenting on 04/21/2024 for Medicare Wellness  The patient presents for annual medicare wellness, complete physical and review of chronic health problems. He/She also has the following acute concerns today:  none  I have personally reviewed the Medicare Annual Wellness questionnaire and have noted 1. The patient's medical and social history 2. Their use of alcohol, tobacco or illicit drugs 3. Their current medications and supplements 4. The patient's functional ability including ADL's, fall risks, home safety risks and hearing or visual             impairment. 5. Diet and physical activities 6. Evidence for depression or mood disorders 7.         Updated provider list Cognitive evaluation was performed and recorded on pt medicare questionnaire form. The patients weight, height, BMI and visual acuity have been recorded in the chart   I have made referrals, counseling and provided education to the patient based review of the above and I have provided the pt with a written personalized care plan for preventive services.   Documentation of this information was scanned into the electronic record under the media tab.   Advance directives and end of life planning reviewed in detail with patient and documented in EMR. Patient given handout on advance care directives if needed. HCPOA and living will updated if needed.  Hearing Screening - Comments:: Wears Glasses-Yearly Eye Exam at [facility] Screening - Comments:: No Concerns with Hearing  Flowsheet Row Office Visit from  04/21/2024 in [facility]  PHQ-2 Total Score 0      04/21/2024   11:40 AM 02/24/2024   10:37 AM 04/02/2023    2:17 PM 03/24/2023   11:52 AM 01/23/2022    2:32 PM  Fall Risk   Falls in the past year? 0 0 0 0 0  Number falls in past yr: 0 0 0 0 0  Injury with Fall? 0 0  0  0  0   Risk for fall due to : No Fall Risks No Fall Risks No Fall Risks No Fall Risks No Fall Risks  Follow up Falls evaluation completed Falls evaluation completed Falls evaluation completed Falls evaluation completed Falls prevention discussed      Data saved with a previous flowsheet row definition   Hypertension:    Good control losartan  HCTZ BP Readings from Last 3 Encounters:  04/21/24 100/70  02/24/24 136/88  09/27/23 (!) 145/77  Using medication without problems or lightheadedness: occ Chest pain with exertion: none Edema: none Short of breath: none Average home BPs:  not checking recently. Other issues:  Elevated Cholesterol: On atorvastatin  10 mg daily Lab Results  Component Value Date   CHOL 184 04/14/2024   HDL 84.00 04/14/2024   LDLCALC 86 04/14/2024   TRIG 70.0 04/14/2024   CHOLHDL 2 04/14/2024  Using medications without problems: using coQ10 for SE. Muscle aches:  Diet  compliance: healthy Exercise: gym 4 times a week, walking. Other complaints: The 10-year ASCVD risk score (Arnett DK, et al., 2019) is: 7.1%   Values used to calculate the score:     Age: 80 years     Clinically relevant sex: Female     Is Non-Hispanic African American: No     Diabetic: No     Tobacco smoker: No     Systolic Blood Pressure: 100 mmHg     Is BP treated: Yes     HDL Cholesterol: 84 mg/dL     Total Cholesterol: 184 mg/dL  GAD resolved on no med.     She has lost 10 lbs in last year BMI 31 Wt Readings from Last 3 Encounters:  04/21/24 176 lb (79.8 kg)  02/24/24 176 lb (79.8 kg)  09/27/23 192 lb (87.1 kg)     Relevant past medical, surgical, family and social  history reviewed and updated as indicated. Interim medical history since our last visit reviewed. Allergies and medications reviewed and updated. Outpatient Medications Prior to Visit  Medication Sig Dispense Refill   atorvastatin  (LIPITOR) 10 MG tablet TAKE 1 TABLET BY MOUTH EVERY DAY 100 tablet 0   betamethasone  dipropionate 0.05 % cream Apply topically 2 (two) times daily. 30 g 0   Coenzyme Q10 (CO Q 10 PO) Take 1 tablet by mouth. 4 times a week     Cyanocobalamin (VITAMIN B-12 PO) Take 1 tablet by mouth daily.     fluticasone  (FLONASE) 50 MCG/ACT nasal spray Place 2 sprays into both nostrils daily as needed.      hydrochlorothiazide  (MICROZIDE ) 12.5 MG capsule TAKE 1 CAPSULE BY MOUTH EVERY DAY 90 capsule 3   Ibuprofen (ADVIL PO) Take by mouth as needed.     losartan  (COZAAR ) 50 MG tablet TAKE 1 TABLET BY MOUTH EVERY DAY 100 tablet 0   montelukast  (SINGULAIR ) 10 MG tablet TAKE 1 TABLET BY MOUTH EVERY DAY AS NEEDED 90 tablet 0   Multiple Vitamins-Minerals (MULTIVITAMIN PO) Take by mouth daily.     valACYclovir  (VALTREX ) 1000 MG tablet Take one for 3 days at onset of outbreak 90 tablet 1   VITAMIN D , ERGOCALCIFEROL , PO Take 1,000 Int'l Units by mouth daily.      fluticasone -salmeterol (WIXELA INHUB) 100-50 MCG/ACT AEPB INHALE 1 PUFF INTO THE LUNGS TWICE A DAY 60 each 2   benzonatate  (TESSALON ) 200 MG capsule Take 1 capsule (200 mg total) by mouth 3 (three) times daily as needed for cough. 20 capsule 0   nystatin  cream (MYCOSTATIN ) Apply 1 Application topically 2 (two) times daily. Apply to affected area of the clitoral hood. Typically needed for 1-2 weeks. 30 g 1   predniSONE  (DELTASONE ) 20 MG tablet 2 tabs by mouth daily x 3 days, then 1 tabs by mouth daily x 2 days 8 tablet 0   No facility-administered medications prior to visit.     Per HPI unless specifically indicated in ROS section below Review of Systems  Constitutional:  Negative for fatigue and fever.  HENT:  Negative for  congestion and ear pain.   Eyes:  Negative for pain.  Respiratory:  Positive for cough and shortness of breath. Negative for chest tightness.   Cardiovascular:  Negative for chest pain, palpitations and leg swelling.  Gastrointestinal:  Negative for abdominal pain.  Genitourinary:  Negative for dysuria and vaginal bleeding.  Musculoskeletal:  Negative for back pain.  Neurological:  Negative for syncope, light-headedness and headaches.  Psychiatric/Behavioral:  Negative for dysphoric  mood.    Objective:  BP 100/70   Pulse 63   Temp (!) 97.5 F (36.4 C) (Temporal)   Ht 5' 2.5 (1.588 m)   Wt 176 lb (79.8 kg)   LMP 04/13/2004   SpO2 96%   BMI 31.68 kg/m   Wt Readings from Last 3 Encounters:  04/21/24 176 lb (79.8 kg)  02/24/24 176 lb (79.8 kg)  09/27/23 192 lb (87.1 kg)      Physical Exam Constitutional:      General: She is not in acute distress.    Appearance: Normal appearance. She is well-developed. She is not ill-appearing or toxic-appearing.  HENT:     Head: Normocephalic.     Right Ear: Hearing, tympanic membrane, ear canal and external ear normal. Tympanic membrane is not erythematous, retracted or bulging.     Left Ear: Hearing, tympanic membrane, ear canal and external ear normal. Tympanic membrane is not erythematous, retracted or bulging.     Nose: No mucosal edema or rhinorrhea.     Right Sinus: No maxillary sinus tenderness or frontal sinus tenderness.     Left Sinus: No maxillary sinus tenderness or frontal sinus tenderness.     Mouth/Throat:     Pharynx: Uvula midline.  Eyes:     General: Lids are normal. Lids are everted, no foreign bodies appreciated.     Conjunctiva/sclera: Conjunctivae normal.     Pupils: Pupils are equal, round, and reactive to light.  Neck:     Thyroid : No thyroid  mass or thyromegaly.     Vascular: No carotid bruit.     Trachea: Trachea normal.  Cardiovascular:     Rate and Rhythm: Normal rate and regular rhythm.     Pulses: Normal  pulses.     Heart sounds: Normal heart sounds, S1 normal and S2 normal. No murmur heard.    No friction rub. No gallop.  Pulmonary:     Effort: Pulmonary effort is normal. No tachypnea or respiratory distress.     Breath sounds: Decreased breath sounds and wheezing present. No rhonchi or rales.     Comments: Wheeze and air movement improved status post DuoNeb. Abdominal:     General: Bowel sounds are normal.     Palpations: Abdomen is soft.     Tenderness: There is no abdominal tenderness.  Musculoskeletal:     Cervical back: Normal range of motion and neck supple.  Skin:    General: Skin is warm and dry.     Findings: No rash.  Neurological:     Mental Status: She is alert.  Psychiatric:        Mood and Affect: Mood is not anxious or depressed.        Speech: Speech normal.        Behavior: Behavior normal. Behavior is cooperative.        Thought Content: Thought content normal.        Judgment: Judgment normal.       Results for orders placed or performed in visit on 04/14/24  Lipid panel   Collection Time: 04/14/24  8:09 AM  Result Value Ref Range   Cholesterol 184 28 - 200 mg/dL   Triglycerides 29.9 89.9 - 149.0 mg/dL   HDL 15.99 >60.99 mg/dL   VLDL 85.9 0.0 - 59.9 mg/dL   LDL Cholesterol 86 10 - 99 mg/dL   Total CHOL/HDL Ratio 2    NonHDL 99.71   Comprehensive metabolic panel   Collection Time: 04/14/24  8:09 AM  Result Value Ref Range   Sodium 142 135 - 145 mEq/L   Potassium 3.9 3.5 - 5.1 mEq/L   Chloride 105 96 - 112 mEq/L   CO2 31 19 - 32 mEq/L   Glucose, Bld 99 70 - 99 mg/dL   BUN 16 6 - 23 mg/dL   Creatinine, Ser 9.22 0.40 - 1.20 mg/dL   Total Bilirubin 0.5 0.2 - 1.2 mg/dL   Alkaline Phosphatase 80 39 - 117 U/L   AST 19 5 - 37 U/L   ALT 21 3 - 35 U/L   Total Protein 6.8 6.0 - 8.3 g/dL   Albumin 4.2 3.5 - 5.2 g/dL   GFR 22.00 >39.99 mL/min   Calcium  9.5 8.4 - 10.5 mg/dL    This visit occurred during the SARS-CoV-2 public health emergency.  Safety  protocols were in place, including screening questions prior to the visit, additional usage of staff PPE, and extensive cleaning of exam room while observing appropriate contact time as indicated for disinfecting solutions.   COVID 19 screen:  No recent travel or known exposure to COVID19 The patient denies respiratory symptoms of COVID 19 at this time. The importance of social distancing was discussed today.   Assessment and Plan The patient's preventative maintenance and recommended screening tests for an annual wellness exam were reviewed in full today. Brought up to date unless services declined.  Counselled on the importance of diet, exercise, and its role in overall health and mortality. The patient's FH and SH was reviewed, including their home life, tobacco status, and drug and alcohol status.   Vaccines: Uptodate tdap, had shingles vaccine in 2015, uptodate PNA13/PNA23, COVID x 3 and high dose flu  done, RSV done  Pap/DVE:  Per GYN, 09/28/2018, no further indicated. Mammo:  12/2023 Bone Density: 2016, 2023 normal.repeat in 5 years...  Colon:  Mother colon cancer repeat q 5 years , Buccini.. 05/06/2023 Smoking Status:none ETOH/ drug use:  couple times a month/ none  Hep C: neg  HIV screen: neg    Problem List Items Addressed This Visit     Class 1 obesity due to excess calories with serious comorbidity and body mass index (BMI) of 31.0 to 31.9 in adult    Congratulated on significant weight loss. Encouraged exercise, weight loss, healthy eating habits.       Elevated glucose   History of melanoma   Melanoma lower left leg 2001, left shoulder 2018 Followed by Derm Dr. Lomax. No new concerning lesions today.      Hyperlipidemia   Stable, chronic.  Continue current medication.   Atorvastatin  10 mg p.o. daily      Hypertension   Stable, chronic.  Continue current medication.   Losartan  50 mg p.o. daily HCTZ 12.5 mg p.o. daily      Other Visit Diagnoses        Medicare annual wellness visit, subsequent    -  Primary       A1C 5.8.SABRA  prediabetes.. work on low carb diet, exercise and weight management.   Greig Ring, MD   "

## 2024-04-21 NOTE — Assessment & Plan Note (Signed)
"   Congratulated on significant weight loss. Encouraged exercise, weight loss, healthy eating habits.  "

## 2025-04-17 ENCOUNTER — Other Ambulatory Visit

## 2025-04-24 ENCOUNTER — Encounter: Admitting: Family Medicine
